# Patient Record
Sex: Male | Born: 1955 | Race: White | Hispanic: No | Marital: Single | Smoking: Former smoker
Health system: Southern US, Community
[De-identification: ages and names within clinical notes are randomized; demographics above are authoritative.]

## PROBLEM LIST (undated history)

## (undated) DIAGNOSIS — J449 Chronic obstructive pulmonary disease, unspecified: Secondary | ICD-10-CM

## (undated) HISTORY — PX: APPENDECTOMY: SHX54

---

## 2014-06-19 ENCOUNTER — Emergency Department
Admit: 2014-06-19 | Disposition: A | Discharge status: Home / Self Care | Payer: Self-pay | Admitting: Emergency Medicine

## 2014-06-19 ENCOUNTER — Encounter: Payer: Self-pay | Admitting: Family Medicine

## 2015-07-08 ENCOUNTER — Emergency Department
Admission: EM | Admit: 2015-07-08 | Discharge: 2015-07-08 | Disposition: A | Discharge status: Home / Self Care | Payer: Self-pay | Attending: Emergency Medicine | Admitting: Emergency Medicine

## 2015-07-08 ENCOUNTER — Emergency Department: Payer: Self-pay

## 2015-07-08 ENCOUNTER — Encounter: Payer: Self-pay | Admitting: Emergency Medicine

## 2015-07-08 DIAGNOSIS — F17200 Nicotine dependence, unspecified, uncomplicated: Secondary | ICD-10-CM | POA: Insufficient documentation

## 2015-07-08 DIAGNOSIS — J209 Acute bronchitis, unspecified: Secondary | ICD-10-CM | POA: Insufficient documentation

## 2015-07-08 DIAGNOSIS — J441 Chronic obstructive pulmonary disease with (acute) exacerbation: Secondary | ICD-10-CM | POA: Insufficient documentation

## 2015-07-08 DIAGNOSIS — J440 Chronic obstructive pulmonary disease with acute lower respiratory infection: Secondary | ICD-10-CM | POA: Insufficient documentation

## 2015-07-08 HISTORY — DX: Chronic obstructive pulmonary disease, unspecified: J44.9

## 2015-07-08 LAB — BASIC METABOLIC PANEL
ANION GAP: 12 (ref 5–15)
BUN: 5 mg/dL — ABNORMAL LOW (ref 6–20)
CALCIUM: 8.8 mg/dL — AB (ref 8.9–10.3)
CO2: 24 mmol/L (ref 22–32)
Chloride: 100 mmol/L — ABNORMAL LOW (ref 101–111)
Creatinine, Ser: 0.58 mg/dL — ABNORMAL LOW (ref 0.61–1.24)
GFR calc Af Amer: >60 mL/min (ref >60)
Glucose, Bld: 135 mg/dL — ABNORMAL HIGH (ref 65–99)
Potassium: 3.9 mmol/L (ref 3.5–5.1)
SODIUM: 136 mmol/L (ref 135–145)

## 2015-07-08 LAB — CBC WITH DIFFERENTIAL/PLATELET
Basophils Absolute: 0 10*3/uL (ref 0–0.1)
Eosinophils Absolute: 0 10*3/uL (ref 0–0.7)
Eosinophils Relative: 0 %
HEMATOCRIT: 47.1 % (ref 40.0–52.0)
HEMOGLOBIN: 16.1 g/dL (ref 13.0–18.0)
Lymphocytes Relative: 7 %
Lymphs Abs: 0.8 10*3/uL — ABNORMAL LOW (ref 1.0–3.6)
MCH: 33 pg (ref 26.0–34.0)
MCHC: 34.1 g/dL (ref 32.0–36.0)
MCV: 96.8 fL (ref 80.0–100.0)
Monocytes Absolute: 1.2 10*3/uL — ABNORMAL HIGH (ref 0.2–1.0)
Monocytes Relative: 12 %
NEUTROS ABS: 8.3 10*3/uL — AB (ref 1.4–6.5)
Neutrophils Relative %: 80 %
Platelets: 184 10*3/uL (ref 150–440)
RBC: 4.86 MIL/uL (ref 4.40–5.90)
RDW: 12.4 % (ref 11.5–14.5)
WBC: 10.4 10*3/uL (ref 3.8–10.6)

## 2015-07-08 MED ORDER — IPRATROPIUM-ALBUTEROL 0.5-2.5 (3) MG/3ML IN SOLN
3.0000 mL | Freq: Once | RESPIRATORY_TRACT | Status: AC
  Administered 2015-07-08: 3 mL via RESPIRATORY_TRACT
  Filled 2015-07-08: qty 3

## 2015-07-08 MED ORDER — NAPROXEN 500 MG PO TABS: 500.0000 mg | ORAL_TABLET | Freq: Two times a day (BID) | ORAL | Status: DC

## 2015-07-08 MED ORDER — ALBUTEROL SULFATE HFA 108 (90 BASE) MCG/ACT IN AERS: 2.0000 | INHALATION_SPRAY | RESPIRATORY_TRACT | Status: DC | PRN

## 2015-07-08 MED ORDER — AZITHROMYCIN 250 MG PO TABS: ORAL_TABLET | ORAL | Status: DC

## 2015-07-08 MED ORDER — PREDNISONE 20 MG PO TABS: 40.0000 mg | ORAL_TABLET | Freq: Every day | ORAL | Status: DC

## 2015-07-08 MED ORDER — METHYLPREDNISOLONE SODIUM SUCC 125 MG IJ SOLR
125.0000 mg | Freq: Once | INTRAMUSCULAR | Status: AC
  Administered 2015-07-08: 125 mg via INTRAVENOUS
  Filled 2015-07-08: qty 2

## 2015-07-08 MED ORDER — GUAIFENESIN 100 MG/5ML PO SOLN: 5.0000 mL | ORAL | Status: DC | PRN

## 2015-07-08 NOTE — ED Provider Notes (Signed)
[facility] Emergency Department Provider Note  ____________________________________________  Time seen: 10:10 AM  I have reviewed the triage vital signs and the nursing notes.   HISTORY  Chief Complaint Shortness of Breath; Cough; and Nasal Congestion    HPI Charles Campos is a 60 y.o. male who complains of worsening shortness of breath and coughing that has become productive over the past 2 weeks. No chest pain. No exertional symptoms. No fevers chills dizziness or syncope. Discussed this. Does have some night sweats but no fevers or chills.     Past Medical History  Diagnosis Date  . COPD (chronic obstructive pulmonary disease) (HCC)      There are no active problems to display for this patient.    History reviewed. No pertinent past surgical history.   Current Outpatient Rx  Name  Route  Sig  Dispense  Refill  . albuterol (PROVENTIL HFA) 108 (90 Base) MCG/ACT inhaler   Inhalation   Inhale 2 puffs into the lungs every 4 (four) hours as needed for wheezing or shortness of breath.   1 Inhaler   0   . azithromycin (ZITHROMAX Z-PAK) 250 MG tablet      Take 2 tablets (500 mg) on  Day 1,  followed by 1 tablet (250 mg) once daily on Days 2 through 5.   6 each   0   . guaiFENesin (ROBITUSSIN) 100 MG/5ML SOLN   Oral   Take 5 mLs (100 mg total) by mouth every 4 (four) hours as needed for cough or to loosen phlegm.   120 mL   0   . naproxen (NAPROSYN) 500 MG tablet   Oral   Take 1 tablet (500 mg total) by mouth 2 (two) times daily with a meal.   20 tablet   0   . predniSONE (DELTASONE) 20 MG tablet   Oral   Take 2 tablets (40 mg total) by mouth daily.   8 tablet   0    No current outpatient medications.  Allergies Review of patient's allergies indicates no known allergies.   History reviewed. No pertinent family history.  Social History Social History  Substance Use Topics  . Smoking status: Current Every Day Smoker   . Smokeless tobacco: None  . Alcohol Use: Yes    Review of Systems  Constitutional:   No fever or chills.  Eyes:   No vision changes.  ENT:   No sore throat. No rhinorrhea. Cardiovascular:   No chest pain. Respiratory:   Positive shortness of breath and productive cough. Gastrointestinal:   Negative for abdominal pain, vomiting and diarrhea.  Genitourinary:   Negative for dysuria or difficulty urinating. Musculoskeletal:   Negative for focal pain or swelling Neurological:   Negative for headaches 10-point ROS otherwise negative.  ____________________________________________   PHYSICAL EXAM:  VITAL SIGNS: ED Triage Vitals  Enc Vitals Group     BP 07/08/15 0943 152/89 mmHg     Pulse Rate 07/08/15 0943 100     Resp 07/08/15 0943 20     Temp 07/08/15 0943 97.2 F (36.2 C)     Temp Source 07/08/15 0943 Oral     SpO2 07/08/15 0943 95 %     Weight 07/08/15 0943 117 lb (53.071 kg)     Height 07/08/15 0943  (1.651 m)     Head Cir --      Peak Flow --      Pain Score 07/08/15 0944 3  Pain Loc --      Pain Edu? --      Excl. in GC? --     Vital signs reviewed, nursing assessments reviewed.   Constitutional:   Alert and oriented. Well appearing and in no distress. Eyes:   No scleral icterus. No conjunctival pallor. PERRL. EOMI.  No nystagmus. ENT   Head:   Normocephalic and atraumatic.   Nose:   No congestion/rhinnorhea. No septal hematoma   Mouth/Throat:   MMM, no pharyngeal erythema. No peritonsillar mass.    Neck:   No stridor. No SubQ emphysema. No meningismus. Hematological/Lymphatic/Immunilogical:   No cervical lymphadenopathy. Cardiovascular:   RRR. Symmetric bilateral radial and DP pulses.  No murmurs.  Respiratory:   Normal respiratory effort without tachypnea nor retractions.Diffuse expiratory wheezing and mildly prolonged expert right face  Gastrointestinal:   Soft and nontender. Non distended. There is no CVA tenderness.  No rebound,  rigidity, or guarding. Genitourinary:   deferred Musculoskeletal:   Nontender with normal range of motion in all extremities. No joint effusions.  No lower extremity tenderness.  No edema. Neurologic:   Normal speech and language.  CN 2-10 normal. Motor grossly intact. No gross focal neurologic deficits are appreciated.  Skin:    Skin is warm, dry and intact. No rash noted.  No petechiae, purpura, or bullae.  ____________________________________________    LABS (pertinent positives/negatives) (all labs ordered are listed, but only abnormal results are displayed) Labs Reviewed  BASIC METABOLIC PANEL - Abnormal; Notable for the following:    Chloride 100 (*)    Glucose, Bld 135 (*)    BUN 5 (*)    Creatinine, Ser 0.58 (*)    Calcium 8.8 (*)    All other components within normal limits  CBC WITH DIFFERENTIAL/PLATELET - Abnormal; Notable for the following:    Neutro Abs 8.3 (*)    Lymphs Abs 0.8 (*)    Monocytes Absolute 1.2 (*)    All other components within normal limits   ____________________________________________   EKG  Interpreted by me Normal sinus rhythm rate of 96, normal axis intervals QRS ST and T segments.  ____________________________________________    RADIOLOGY  Chest x-ray shows hyperinflation Brother was unremarkable  ____________________________________________   PROCEDURES   ____________________________________________   INITIAL IMPRESSION / ASSESSMENT AND PLAN / ED COURSE  Pertinent labs & imaging results that were available during my care of the patient were reviewed by me and considered in my medical decision making (see chart for details).  Patient well appearing no acute distress, vital signs unremarkable except for slight tachycardia at triage. On my exam he is not tachycardic. Symptoms and examination are consistent with acute bronchitis and COPD exacerbation. We have the patient steroids and bronchodilators, discharge on prednisone and  albuterol guaifenesin azithromycin and NSAIDs. Follow up with primary care.Considering the patient's symptoms, medical history, and physical examination today, I have low suspicion for ACS, PE, TAD, pneumothorax, carditis, mediastinitis, pneumonia, CHF, or sepsis.       ____________________________________________   FINAL CLINICAL IMPRESSION(S) / ED DIAGNOSES  Final diagnoses:  COPD with exacerbation (HCC)  Acute bronchitis, unspecified organism       Portions of this note were generated with dragon dictation software. Dictation errors may occur despite best attempts at proofreading.   Sharman CheekPhillip Chirsty Armistead, MD 07/08/15 1049

## 2015-07-08 NOTE — Discharge Instructions (Signed)
Acute Bronchitis Bronchitis is inflammation of the airways that extend from the windpipe into the lungs (bronchi). The inflammation often causes mucus to develop. This leads to a cough, which is the most common symptom of bronchitis.  In acute bronchitis, the condition usually develops suddenly and goes away over time, usually in a couple weeks. Smoking, allergies, and asthma can make bronchitis worse. Repeated episodes of bronchitis may cause further lung problems.  CAUSES Acute bronchitis is most often caused by the same virus that causes a cold. The virus can spread from person to person (contagious) through coughing, sneezing, and touching contaminated objects. SIGNS AND SYMPTOMS   Cough.   Fever.   Coughing up mucus.   Body aches.   Chest congestion.   Chills.   Shortness of breath.   Sore throat.  DIAGNOSIS  Acute bronchitis is usually diagnosed through a physical exam. Your health care provider will also ask you questions about your medical history. Tests, such as chest X-rays, are sometimes done to rule out other conditions.  TREATMENT  Acute bronchitis usually goes away in a couple weeks. Oftentimes, no medical treatment is necessary. Medicines are sometimes given for relief of fever or cough. Antibiotic medicines are usually not needed but may be prescribed in certain situations. In some cases, an inhaler may be recommended to help reduce shortness of breath and control the cough. A cool mist vaporizer may also be used to help thin bronchial secretions and make it easier to clear the chest.  HOME CARE INSTRUCTIONS  Get plenty of rest.   Drink enough fluids to keep your urine clear or pale yellow (unless you have a medical condition that requires fluid restriction). Increasing fluids may help thin your respiratory secretions (sputum) and reduce chest congestion, and it will prevent dehydration.   Take medicines only as directed by your health care provider.  If  you were prescribed an antibiotic medicine, finish it all even if you start to feel better.  Avoid smoking and secondhand smoke. Exposure to cigarette smoke or irritating chemicals will make bronchitis worse. If you are a smoker, consider using nicotine gum or skin patches to help control withdrawal symptoms. Quitting smoking will help your lungs heal faster.   Reduce the chances of another bout of acute bronchitis by washing your hands frequently, avoiding people with cold symptoms, and trying not to touch your hands to your mouth, nose, or eyes.   Keep all follow-up visits as directed by your health care provider.  SEEK MEDICAL CARE IF: Your symptoms do not improve after 1 week of treatment.  SEEK IMMEDIATE MEDICAL CARE IF:  You develop an increased fever or chills.   You have chest pain.   You have severe shortness of breath.  You have bloody sputum.   You develop dehydration.  You faint or repeatedly feel like you are going to pass out.  You develop repeated vomiting.  You develop a severe headache. MAKE SURE YOU:   Understand these instructions.  Will watch your condition.  Will get help right away if you are not doing well or get worse.   This information is not intended to replace advice given to you by your health care provider. Make sure you discuss any questions you have with your health care provider.   Document Released: 03/18/2004 Document Revised: 03/01/2014 Document Reviewed: 08/01/2012 Elsevier Interactive Patient Education 2016 [employer]  Chronic Obstructive Pulmonary Disease Exacerbation Chronic obstructive pulmonary disease (COPD) is a common lung problem. In COPD, the  flow of air from the lungs is limited. COPD exacerbations are times that breathing gets worse and you need extra treatment. Without treatment they can be life threatening. If they happen often, your lungs can become more damaged. If your COPD gets worse, your doctor may treat you  with:  Medicines.  Oxygen.  Different ways to clear your airway, such as using a mask. HOME CARE  Do not smoke.  Avoid tobacco smoke and other things that bother your lungs.  If given, take your antibiotic medicine as told. Finish the medicine even if you start to feel better.  Only take medicines as told by your doctor.  Drink enough fluids to keep your pee (urine) clear or pale yellow (unless your doctor has told you not to).  Use a cool mist machine (vaporizer).  If you use oxygen or a machine that turns liquid medicine into a mist (nebulizer), continue to use them as told.  Keep up with shots (vaccinations) as told by your doctor.  Exercise regularly.  Eat healthy foods.  Keep all doctor visits as told. GET HELP RIGHT AWAY IF:  You are very short of breath and it gets worse.  You have trouble talking.  You have bad chest pain.  You have blood in your spit (sputum).  You have a fever.  You keep throwing up (vomiting).  You feel weak, or you pass out (faint).  You feel confused.  You keep getting worse. MAKE SURE YOU:  Understand these instructions.  Will watch your condition.  Will get help right away if you are not doing well or get worse.   This information is not intended to replace advice given to you by your health care provider. Make sure you discuss any questions you have with your health care provider.   Document Released: 01/28/2011 Document Revised: 03/01/2014 Document Reviewed: 10/13/2012 Elsevier Interactive Patient Education [employer]

## 2015-07-08 NOTE — ED Notes (Signed)
Pt to ed with c/o sob and difficulty breathing.  Pt states seen here about 1 year ago and dx with COPD.  Pt reports he does smoke about 1 pack every 3 days.  Pt states reports minimal productive green sputum from cough.  Pt states fever about 4 days ago.  Pt denies sore throat.  Denies earaches. No swelling noted to lower ext.

## 2015-07-08 NOTE — ED Notes (Signed)
MD at bedside. 

## 2017-05-25 IMAGING — CR DG CHEST 2V
1 series · 2 of 2 positions shown · non-contrast
Comparison: June 19, 2014

CLINICAL DATA: Shortness of breath and cough, chronic

EXAM:
CHEST  2 VIEW

[Series 1: dg chest 2 view · 0.14mm/px · 2 of 2 slices shown]
[im 1/2]
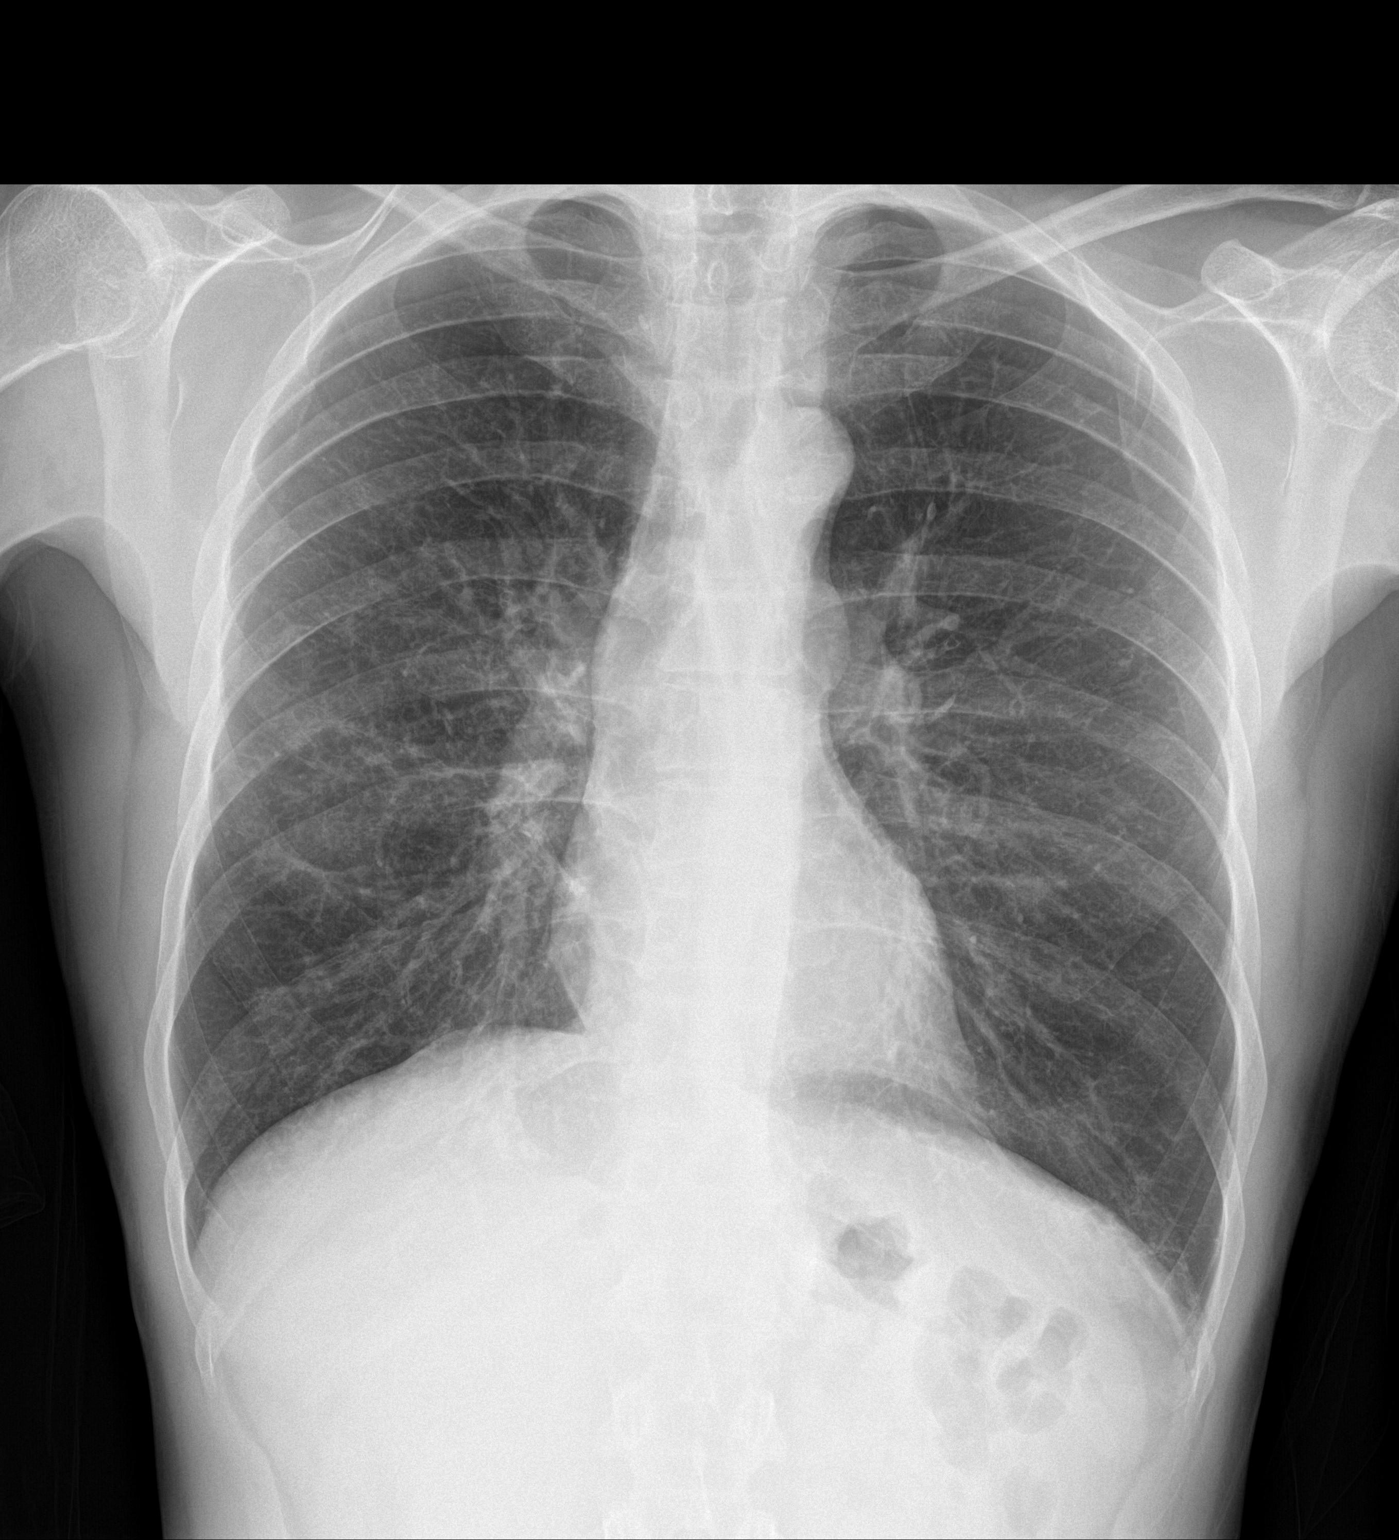
[im 2/2]
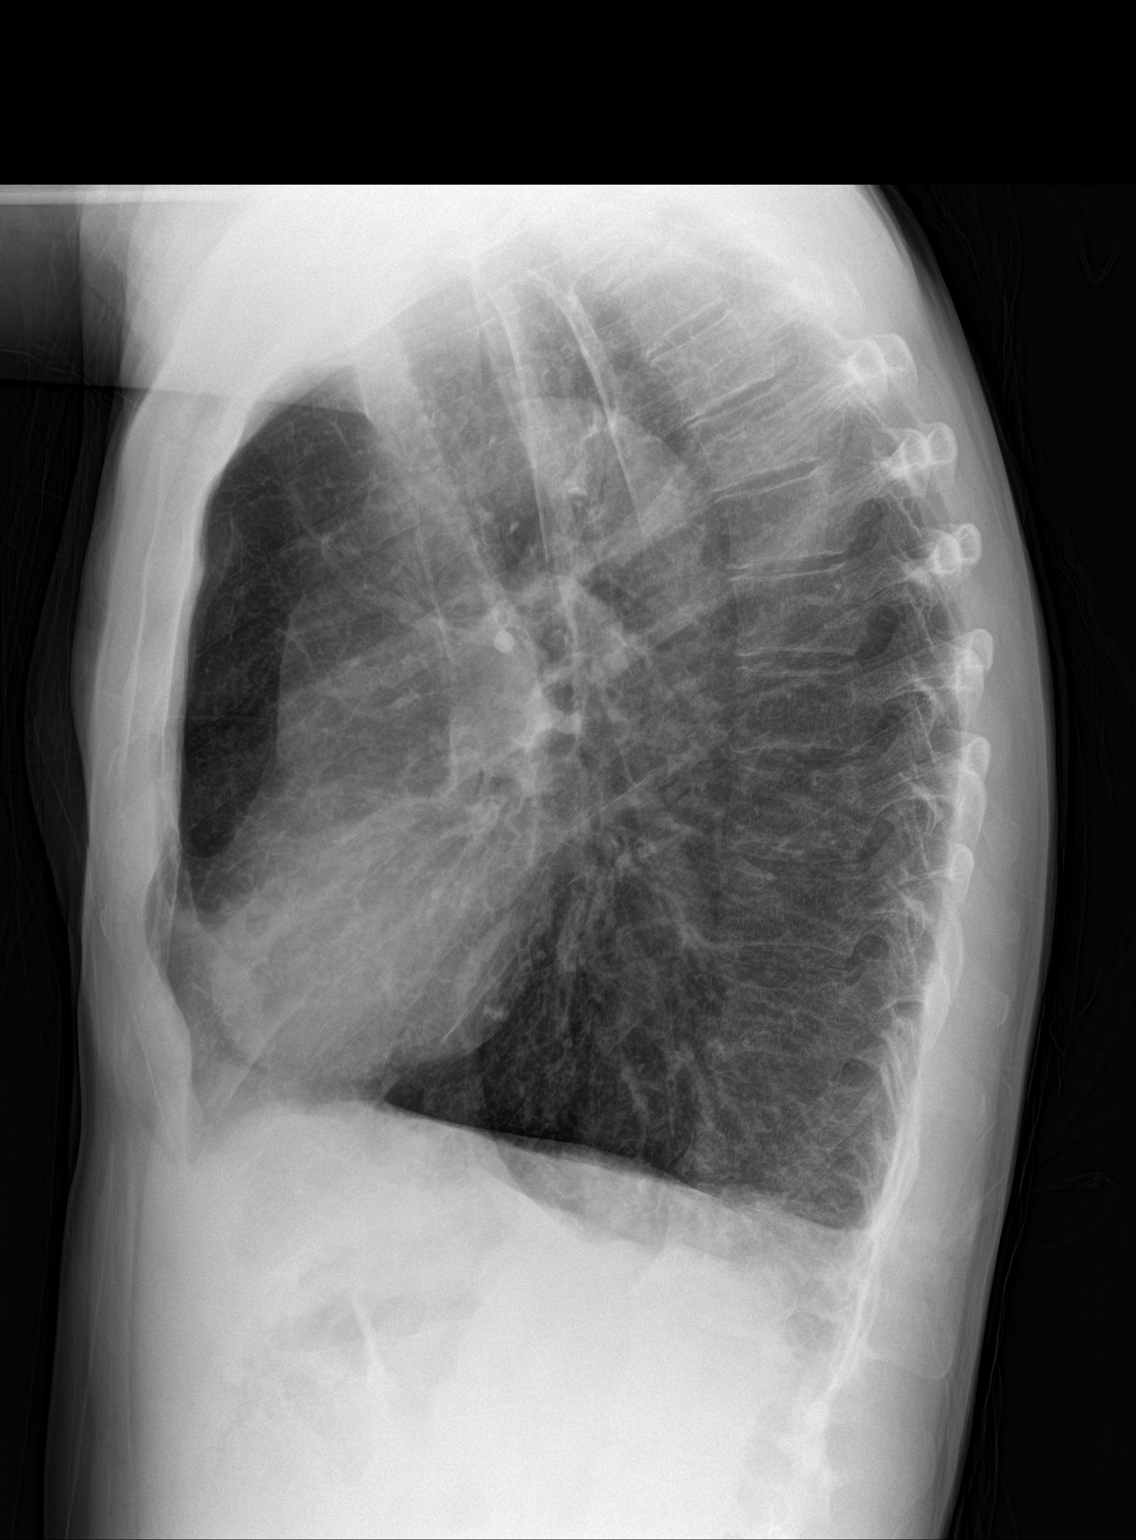

[2 of 2 positions shown; findings below may reference images not displayed]

FINDINGS: Lungs are hyperexpanded. There is mild scarring in the right mid
lung. There is no edema or consolidation. Heart size and pulmonary
vascularity are within normal limits. No adenopathy. No bone
lesions.
IMPRESSION: Underlying hyper expansion with mild scarring right mid lung. No
edema or consolidation. Stable cardiac silhouette.

## 2018-06-07 ENCOUNTER — Other Ambulatory Visit: Payer: Self-pay

## 2018-06-07 ENCOUNTER — Emergency Department
Admission: EM | Admit: 2018-06-07 | Discharge: 2018-06-07 | Disposition: A | Discharge status: Home / Self Care | Payer: Self-pay | Attending: Emergency Medicine | Admitting: Emergency Medicine

## 2018-06-07 ENCOUNTER — Emergency Department: Payer: Self-pay

## 2018-06-07 DIAGNOSIS — F17210 Nicotine dependence, cigarettes, uncomplicated: Secondary | ICD-10-CM | POA: Insufficient documentation

## 2018-06-07 DIAGNOSIS — J449 Chronic obstructive pulmonary disease, unspecified: Secondary | ICD-10-CM | POA: Insufficient documentation

## 2018-06-07 DIAGNOSIS — Z23 Encounter for immunization: Secondary | ICD-10-CM | POA: Insufficient documentation

## 2018-06-07 DIAGNOSIS — Y929 Unspecified place or not applicable: Secondary | ICD-10-CM | POA: Insufficient documentation

## 2018-06-07 DIAGNOSIS — S0990XA Unspecified injury of head, initial encounter: Secondary | ICD-10-CM

## 2018-06-07 DIAGNOSIS — Z79899 Other long term (current) drug therapy: Secondary | ICD-10-CM | POA: Insufficient documentation

## 2018-06-07 DIAGNOSIS — Y939 Activity, unspecified: Secondary | ICD-10-CM | POA: Insufficient documentation

## 2018-06-07 DIAGNOSIS — Y999 Unspecified external cause status: Secondary | ICD-10-CM | POA: Insufficient documentation

## 2018-06-07 DIAGNOSIS — S0101XA Laceration without foreign body of scalp, initial encounter: Secondary | ICD-10-CM | POA: Insufficient documentation

## 2018-06-07 MED ORDER — LIDOCAINE-EPINEPHRINE (PF) 2 %-1:200000 IJ SOLN
20.0000 mL | Freq: Once | INTRAMUSCULAR | Status: AC
  Administered 2018-06-07: 20 mL
  Filled 2018-06-07: qty 20

## 2018-06-07 MED ORDER — TETANUS-DIPHTH-ACELL PERTUSSIS 5-2.5-18.5 LF-MCG/0.5 IM SUSP
0.5000 mL | Freq: Once | INTRAMUSCULAR | Status: AC
  Administered 2018-06-07: 19:00:00 0.5 mL via INTRAMUSCULAR
  Filled 2018-06-07: qty 0.5

## 2018-06-07 NOTE — ED Notes (Signed)
MD placed 9 staples in head and patient is speaking with brother in [facility] to give him an update. Nurse obtained taxi voucher to discharge patient to a safe place a friends house.

## 2018-06-07 NOTE — Discharge Instructions (Addendum)
Your CT scans did not show any serious internal injuries today.  The 2 lacerations on the top of your head were repaired with a total of 9 staples.  These will need to be removed in 1 week.  You can follow-up with your primary care doctor, urgent care or the ER to have this taken care of.  You should also be seen in 2 days to ensure that the wound is healing well and not getting infected.  You were given a tetanus shot today to protect you from some types of infection.  You may shower as desired, but you should avoid scrubbing the area with your fingers or a brush.
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
## 2018-06-14 ENCOUNTER — Emergency Department
Admission: EM | Admit: 2018-06-14 | Discharge: 2018-06-14 | Disposition: A | Discharge status: Home / Self Care | Payer: Self-pay | Attending: Emergency Medicine | Admitting: Emergency Medicine

## 2018-06-14 ENCOUNTER — Other Ambulatory Visit: Payer: Self-pay

## 2018-06-14 DIAGNOSIS — Z4802 Encounter for removal of sutures: Secondary | ICD-10-CM | POA: Insufficient documentation

## 2018-06-14 DIAGNOSIS — J449 Chronic obstructive pulmonary disease, unspecified: Secondary | ICD-10-CM | POA: Insufficient documentation

## 2018-06-14 DIAGNOSIS — Z79899 Other long term (current) drug therapy: Secondary | ICD-10-CM | POA: Insufficient documentation

## 2018-06-14 DIAGNOSIS — F17210 Nicotine dependence, cigarettes, uncomplicated: Secondary | ICD-10-CM | POA: Insufficient documentation

## 2018-06-14 NOTE — ED Notes (Signed)
PA, Wagner to bedside for staple removal.

## 2018-06-14 NOTE — ED Provider Notes (Signed)
[facility] Emergency Department Provider Note  ____________________________________________  Time seen: Approximately 1:13 PM  I have reviewed the triage vital signs and the nursing notes.   HISTORY  Chief Complaint Suture / Staple Removal    HPI Charles Campos is a 63 y.o. male that presents the emergency department for staple removal.  No concerns with staples.   Past Medical History:  Diagnosis Date  . COPD (chronic obstructive pulmonary disease) (HCC)     There are no active problems to display for this patient.   History reviewed. No pertinent surgical history.  Prior to Admission medications   Medication Sig Start Date End Date Taking? Authorizing Provider  albuterol (PROVENTIL HFA) 108 (90 Base) MCG/ACT inhaler Inhale 2 puffs into the lungs every 4 (four) hours as needed for wheezing or shortness of breath. 07/08/15   Sharman CheekStafford, Phillip, MD  azithromycin (ZITHROMAX Z-PAK) 250 MG tablet Take 2 tablets (500 mg) on  Day 1,  followed by 1 tablet (250 mg) once daily on Days 2 through 5. Patient not taking: Reported on 06/07/2018 07/08/15   Sharman CheekStafford, Phillip, MD  guaiFENesin (ROBITUSSIN) 100 MG/5ML SOLN Take 5 mLs (100 mg total) by mouth every 4 (four) hours as needed for cough or to loosen phlegm. Patient not taking: Reported on 06/07/2018 07/08/15   Sharman CheekStafford, Phillip, MD  naproxen (NAPROSYN) 500 MG tablet Take 1 tablet (500 mg total) by mouth 2 (two) times daily with a meal. Patient not taking: Reported on 06/07/2018 07/08/15   Sharman CheekStafford, Phillip, MD  predniSONE (DELTASONE) 20 MG tablet Take 2 tablets (40 mg total) by mouth daily. Patient not taking: Reported on 06/07/2018 07/08/15   Sharman CheekStafford, Phillip, MD    Allergies Patient has no known allergies.  No family history on file.  Social History Social History   Tobacco Use  . Smoking status: Current Every Day Smoker  . Smokeless tobacco: Never Used  Substance Use Topics  . Alcohol use: Yes  .  Drug use: No     Review of Systems  Constitutional: No fever/chills Respiratory: No SOB. Musculoskeletal: Negative for musculoskeletal pain. Skin: Negative for rash, ecchymosis.  Positive for stapled laceration. Neurological: Negative for headaches   ____________________________________________   PHYSICAL EXAM:  VITAL SIGNS: ED Triage Vitals [06/14/18 1241]  Enc Vitals Group     BP      Pulse      Resp      Temp      Temp src      SpO2      Weight 115 lb (52.2 kg)     Height 5\' 6"  (1.676 m)     Head Circumference      Peak Flow      Pain Score 0     Pain Loc      Pain Edu?      Excl. in GC?      Constitutional: Alert and oriented. Well appearing and in no acute distress. Eyes: Conjunctivae are normal. PERRL. EOMI. Head: 9 staples in place to scalp.  No surrounding erythema.  No drainage.  No tenderness to palpation. ENT:      Ears:      Nose: No congestion/rhinnorhea.      Mouth/Throat: Mucous membranes are moist.  Neck: No stridor.   Cardiovascular: Good peripheral circulation. Respiratory: Normal respiratory effort without tachypnea or retractions.  Musculoskeletal: Full range of motion to all extremities. No gross deformities appreciated. Neurologic:  Normal speech and language. No gross focal neurologic deficits  are appreciated.  Skin:  Skin is warm, dry. No rash noted. Psychiatric: Mood and affect are normal. Speech and behavior are normal. Patient exhibits appropriate insight and judgement.   ____________________________________________   LABS (all labs ordered are listed, but only abnormal results are displayed)  Labs Reviewed - No data to display ____________________________________________  EKG   ____________________________________________  RADIOLOGY   No results found.  ____________________________________________    PROCEDURES  Procedure(s) performed:    Procedures  SUTURE REMOVAL Performed by: Enid Derry  Consent:  Verbal consent obtained. Consent given by: patient Required items: required blood products, implants, devices, and special equipment available Time out: Immediately prior to procedure a "time out" was called to verify the correct patient, procedure, equipment, support staff and site/side marked as required.  Location: scalp  Wound Appearance: clean  Sutures/Staples Removed: 9  Patient tolerance: Patient tolerated the procedure well with no immediate complications.    Medications - No data to display   ____________________________________________   INITIAL IMPRESSION / ASSESSMENT AND PLAN / ED COURSE  Pertinent labs & imaging results that were available during my care of the patient were reviewed by me and considered in my medical decision making (see chart for details).  Review of the [place] CSRS was performed in accordance of the [facility] prior to dispensing any controlled drugs.     Patient presented to emergency department for staple removal.  Staples were removed in the emergency department.  No signs of infection.  Patient is to follow up with primary care as directed. Patient is given ED precautions to return to the ED for any worsening or new symptoms.     ____________________________________________  FINAL CLINICAL IMPRESSION(S) / ED DIAGNOSES  Final diagnoses:  Encounter for staple removal      NEW MEDICATIONS STARTED DURING THIS VISIT:  ED Discharge Orders    None          This chart was dictated using voice recognition software/Dragon. Despite best efforts to proofread, errors can occur which can change the meaning. Any change was purely unintentional.    Enid Derry, PA-C 06/14/18 1335    Arnaldo Natal, MD 06/14/18 [phone]

## 2018-06-14 NOTE — ED Triage Notes (Signed)
Pt is here for suture removal from scalp. Denies any sx.

## 2019-06-21 ENCOUNTER — Other Ambulatory Visit: Payer: Self-pay
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
## 2021-12-07 ENCOUNTER — Encounter: Payer: Self-pay | Admitting: Internal Medicine

## 2021-12-07 ENCOUNTER — Inpatient Hospital Stay
Admission: EM | Admit: 2021-12-07 | Discharge: 2021-12-14 | DRG: 190 | Disposition: A | Discharge status: Home / Self Care | Payer: Self-pay | Attending: Internal Medicine | Admitting: Internal Medicine

## 2021-12-07 ENCOUNTER — Emergency Department: Payer: Self-pay

## 2021-12-07 ENCOUNTER — Other Ambulatory Visit: Payer: Self-pay

## 2021-12-07 DIAGNOSIS — D72829 Elevated white blood cell count, unspecified: Secondary | ICD-10-CM | POA: Diagnosis present

## 2021-12-07 DIAGNOSIS — Z720 Tobacco use: Secondary | ICD-10-CM | POA: Diagnosis present

## 2021-12-07 DIAGNOSIS — Z1152 Encounter for screening for COVID-19: Secondary | ICD-10-CM

## 2021-12-07 DIAGNOSIS — L89151 Pressure ulcer of sacral region, stage 1: Secondary | ICD-10-CM | POA: Diagnosis present

## 2021-12-07 DIAGNOSIS — Z681 Body mass index (BMI) 19 or less, adult: Secondary | ICD-10-CM

## 2021-12-07 DIAGNOSIS — F1010 Alcohol abuse, uncomplicated: Secondary | ICD-10-CM | POA: Diagnosis present

## 2021-12-07 DIAGNOSIS — F1020 Alcohol dependence, uncomplicated: Secondary | ICD-10-CM | POA: Diagnosis present

## 2021-12-07 DIAGNOSIS — R64 Cachexia: Secondary | ICD-10-CM | POA: Diagnosis present

## 2021-12-07 DIAGNOSIS — K224 Dyskinesia of esophagus: Secondary | ICD-10-CM | POA: Diagnosis present

## 2021-12-07 DIAGNOSIS — Z8249 Family history of ischemic heart disease and other diseases of the circulatory system: Secondary | ICD-10-CM

## 2021-12-07 DIAGNOSIS — R634 Abnormal weight loss: Secondary | ICD-10-CM | POA: Diagnosis present

## 2021-12-07 DIAGNOSIS — E871 Hypo-osmolality and hyponatremia: Secondary | ICD-10-CM | POA: Diagnosis present

## 2021-12-07 DIAGNOSIS — Z79899 Other long term (current) drug therapy: Secondary | ICD-10-CM

## 2021-12-07 DIAGNOSIS — J9601 Acute respiratory failure with hypoxia: Secondary | ICD-10-CM | POA: Diagnosis present

## 2021-12-07 DIAGNOSIS — L8990 Pressure ulcer of unspecified site, unspecified stage: Secondary | ICD-10-CM | POA: Insufficient documentation

## 2021-12-07 DIAGNOSIS — R0602 Shortness of breath: Secondary | ICD-10-CM

## 2021-12-07 DIAGNOSIS — F17210 Nicotine dependence, cigarettes, uncomplicated: Secondary | ICD-10-CM | POA: Diagnosis present

## 2021-12-07 DIAGNOSIS — I10 Essential (primary) hypertension: Secondary | ICD-10-CM | POA: Diagnosis present

## 2021-12-07 DIAGNOSIS — J441 Chronic obstructive pulmonary disease with (acute) exacerbation: Principal | ICD-10-CM | POA: Diagnosis present

## 2021-12-07 DIAGNOSIS — Z23 Encounter for immunization: Secondary | ICD-10-CM

## 2021-12-07 DIAGNOSIS — E440 Moderate protein-calorie malnutrition: Secondary | ICD-10-CM | POA: Diagnosis present

## 2021-12-07 LAB — COMPREHENSIVE METABOLIC PANEL
ALT: 26 U/L (ref 0–44)
AST: 28 U/L (ref 15–41)
Albumin: 3.4 g/dL — ABNORMAL LOW (ref 3.5–5.0)
Alkaline Phosphatase: 47 U/L (ref 38–126)
Anion gap: 10 (ref 5–15)
BUN: 12 mg/dL (ref 8–23)
CO2: 29 mmol/L (ref 22–32)
Calcium: 8.8 mg/dL — ABNORMAL LOW (ref 8.9–10.3)
Chloride: 78 mmol/L — ABNORMAL LOW (ref 98–111)
Creatinine, Ser: 0.46 mg/dL — ABNORMAL LOW (ref 0.61–1.24)
GFR, Estimated: >60 mL/min (ref >60)
Glucose, Bld: 117 mg/dL — ABNORMAL HIGH (ref 70–99)
Potassium: 4.3 mmol/L (ref 3.5–5.1)
Sodium: 117 mmol/L — CL (ref 135–145)
Total Bilirubin: 1.4 mg/dL — ABNORMAL HIGH (ref 0.3–1.2)
Total Protein: 6.9 g/dL (ref 6.5–8.1)

## 2021-12-07 LAB — CBC WITH DIFFERENTIAL/PLATELET
Abs Immature Granulocytes: 0.08 10*3/uL — ABNORMAL HIGH (ref 0.00–0.07)
Basophils Absolute: 0 10*3/uL (ref 0.0–0.1)
Basophils Relative: 0 %
Eosinophils Absolute: 0 10*3/uL (ref 0.0–0.5)
Eosinophils Relative: 0 %
HCT: 44.8 % (ref 39.0–52.0)
Hemoglobin: 16.2 g/dL (ref 13.0–17.0)
Immature Granulocytes: 1 %
Lymphocytes Relative: 3 %
Lymphs Abs: 0.4 10*3/uL — ABNORMAL LOW (ref 0.7–4.0)
MCH: 32.3 pg (ref 26.0–34.0)
MCHC: 36.2 g/dL — ABNORMAL HIGH (ref 30.0–36.0)
MCV: 89.4 fL (ref 80.0–100.0)
Monocytes Absolute: 2 10*3/uL — ABNORMAL HIGH (ref 0.1–1.0)
Monocytes Relative: 17 %
Neutro Abs: 9.7 10*3/uL — ABNORMAL HIGH (ref 1.7–7.7)
Neutrophils Relative %: 79 %
Platelets: 194 10*3/uL (ref 150–400)
RBC: 5.01 MIL/uL (ref 4.22–5.81)
RDW: 10.7 % — ABNORMAL LOW (ref 11.5–15.5)
Smear Review: NORMAL
WBC: 12.3 10*3/uL — ABNORMAL HIGH (ref 4.0–10.5)
nRBC: 0 % (ref 0.0–0.2)

## 2021-12-07 LAB — RESP PANEL BY RT-PCR (FLU A&B, COVID) ARPGX2
Influenza A by PCR: NEGATIVE
Influenza B by PCR: NEGATIVE
SARS Coronavirus 2 by RT PCR: NEGATIVE

## 2021-12-07 LAB — TROPONIN I (HIGH SENSITIVITY)
Troponin I (High Sensitivity): 11 ng/L (ref <18)
Troponin I (High Sensitivity): 14 ng/L (ref <18)

## 2021-12-07 LAB — LACTIC ACID, PLASMA
Lactic Acid, Venous: 1.3 mmol/L (ref 0.5–1.9)
Lactic Acid, Venous: 1.1 mmol/L (ref 0.5–1.9)

## 2021-12-07 MED ORDER — SODIUM CHLORIDE 0.9 % IV SOLN
1.0000 g | Freq: Once | INTRAVENOUS | Status: AC
  Administered 2021-12-07: 1 g via INTRAVENOUS
  Filled 2021-12-07: qty 10

## 2021-12-07 MED ORDER — SODIUM CHLORIDE 0.9 % IV SOLN
1.0000 g | INTRAVENOUS | Status: AC
  Administered 2021-12-08 – 2021-12-11 (×4): 1 g via INTRAVENOUS
  Filled 2021-12-07: qty 1
  Filled 2021-12-07 (×3): qty 10

## 2021-12-07 MED ORDER — METHYLPREDNISOLONE SODIUM SUCC 40 MG IJ SOLR
40.0000 mg | Freq: Two times a day (BID) | INTRAMUSCULAR | Status: AC
  Administered 2021-12-08 (×2): 40 mg via INTRAVENOUS
  Filled 2021-12-07 (×2): qty 1

## 2021-12-07 MED ORDER — LIDOCAINE 5 % EX PTCH
1.0000 | MEDICATED_PATCH | CUTANEOUS | Status: DC
  Administered 2021-12-07 – 2021-12-13 (×7): 1 via TRANSDERMAL
  Filled 2021-12-07 (×8): qty 1

## 2021-12-07 MED ORDER — ADULT MULTIVITAMIN W/MINERALS CH
1.0000 | ORAL_TABLET | Freq: Every day | ORAL | Status: DC
  Administered 2021-12-07 – 2021-12-14 (×8): 1 via ORAL
  Filled 2021-12-07 (×8): qty 1

## 2021-12-07 MED ORDER — SODIUM CHLORIDE 0.9 % IV SOLN: 500.0000 mg | INTRAVENOUS | Status: DC

## 2021-12-07 MED ORDER — ONDANSETRON HCL 4 MG/2ML IJ SOLN: 4.0000 mg | Freq: Four times a day (QID) | INTRAMUSCULAR | Status: AC | PRN

## 2021-12-07 MED ORDER — SENNOSIDES-DOCUSATE SODIUM 8.6-50 MG PO TABS: 1.0000 | ORAL_TABLET | Freq: Every evening | ORAL | Status: DC | PRN

## 2021-12-07 MED ORDER — LORAZEPAM 1 MG PO TABS
1.0000 mg | ORAL_TABLET | ORAL | Status: AC | PRN
  Administered 2021-12-09 – 2021-12-10 (×3): 1 mg via ORAL
  Filled 2021-12-07 (×3): qty 1

## 2021-12-07 MED ORDER — LORAZEPAM 2 MG/ML IJ SOLN: 1.0000 mg | INTRAMUSCULAR | Status: AC | PRN

## 2021-12-07 MED ORDER — SODIUM CHLORIDE 0.9 % IV BOLUS
1000.0000 mL | Freq: Once | INTRAVENOUS | Status: AC
  Administered 2021-12-07: 1000 mL via INTRAVENOUS

## 2021-12-07 MED ORDER — ALBUTEROL SULFATE (2.5 MG/3ML) 0.083% IN NEBU
2.5000 mg/h | INHALATION_SOLUTION | Freq: Once | RESPIRATORY_TRACT | Status: AC
  Administered 2021-12-07: 2.5 mg/h via RESPIRATORY_TRACT
  Filled 2021-12-07: qty 3

## 2021-12-07 MED ORDER — THIAMINE MONONITRATE 100 MG PO TABS
100.0000 mg | ORAL_TABLET | Freq: Every day | ORAL | Status: DC
  Administered 2021-12-07 – 2021-12-14 (×8): 100 mg via ORAL
  Filled 2021-12-07 (×8): qty 1

## 2021-12-07 MED ORDER — MAGNESIUM SULFATE 2 GM/50ML IV SOLN
2.0000 g | Freq: Once | INTRAVENOUS | Status: AC
  Administered 2021-12-07: 2 g via INTRAVENOUS
  Filled 2021-12-07: qty 50

## 2021-12-07 MED ORDER — DILTIAZEM HCL 60 MG PO TABS
30.0000 mg | ORAL_TABLET | Freq: Once | ORAL | Status: AC
  Administered 2021-12-07: 30 mg via ORAL
  Filled 2021-12-07: qty 1

## 2021-12-07 MED ORDER — ACETAMINOPHEN 325 MG PO TABS: 650.0000 mg | ORAL_TABLET | Freq: Four times a day (QID) | ORAL | Status: AC | PRN

## 2021-12-07 MED ORDER — THIAMINE HCL 100 MG/ML IJ SOLN: 100.0000 mg | Freq: Every day | INTRAMUSCULAR | Status: DC

## 2021-12-07 MED ORDER — METHYLPREDNISOLONE SODIUM SUCC 125 MG IJ SOLR
60.0000 mg | Freq: Once | INTRAMUSCULAR | Status: AC
  Administered 2021-12-07: 60 mg via INTRAVENOUS
  Filled 2021-12-07: qty 2

## 2021-12-07 MED ORDER — NICOTINE 21 MG/24HR TD PT24: 21.0000 mg | MEDICATED_PATCH | Freq: Every day | TRANSDERMAL | Status: DC | PRN

## 2021-12-07 MED ORDER — ACETAMINOPHEN 650 MG RE SUPP: 650.0000 mg | Freq: Four times a day (QID) | RECTAL | Status: AC | PRN

## 2021-12-07 MED ORDER — FOLIC ACID 1 MG PO TABS
1.0000 mg | ORAL_TABLET | Freq: Every day | ORAL | Status: DC
  Administered 2021-12-07 – 2021-12-14 (×8): 1 mg via ORAL
  Filled 2021-12-07 (×8): qty 1

## 2021-12-07 MED ORDER — IPRATROPIUM-ALBUTEROL 0.5-2.5 (3) MG/3ML IN SOLN
3.0000 mL | Freq: Once | RESPIRATORY_TRACT | Status: AC
  Administered 2021-12-07: 3 mL via RESPIRATORY_TRACT
  Filled 2021-12-07: qty 3

## 2021-12-07 MED ORDER — IPRATROPIUM-ALBUTEROL 0.5-2.5 (3) MG/3ML IN SOLN
3.0000 mL | Freq: Four times a day (QID) | RESPIRATORY_TRACT | Status: AC
  Administered 2021-12-07 – 2021-12-08 (×2): 3 mL via RESPIRATORY_TRACT
  Filled 2021-12-07 (×2): qty 3

## 2021-12-07 MED ORDER — SODIUM CHLORIDE 0.9 % IV SOLN
500.0000 mg | Freq: Once | INTRAVENOUS | Status: AC
  Administered 2021-12-07: 500 mg via INTRAVENOUS
  Filled 2021-12-07: qty 5

## 2021-12-07 MED ORDER — ONDANSETRON HCL 4 MG PO TABS: 4.0000 mg | ORAL_TABLET | Freq: Four times a day (QID) | ORAL | Status: AC | PRN

## 2021-12-07 MED ORDER — ENOXAPARIN SODIUM 40 MG/0.4ML IJ SOSY
40.0000 mg | PREFILLED_SYRINGE | INTRAMUSCULAR | Status: DC
  Administered 2021-12-07 – 2021-12-13 (×7): 40 mg via SUBCUTANEOUS
  Filled 2021-12-07 (×7): qty 0.4

## 2021-12-07 NOTE — Assessment & Plan Note (Signed)
-   Patient does not remember a day when he has been without alcohol - Patient's last drink was 12/06/2021 - CIWA protocol initiated

## 2021-12-07 NOTE — Assessment & Plan Note (Signed)
-   Patient is status post Solu-Medrol 60 mg IV one-time dose, 2 doses of DuoNeb lighters, 1 treatment of albuterol nebulizer and 1 dose of magnesium 2 g IV once, per EDP prior to hospital admission - Continue with DuoNebs 4 times daily, 3 doses ordered - Solu-Medrol 40 mg IV twice daily starting on 12/08/2021, 2 doses ordered

## 2021-12-07 NOTE — Hospital Course (Addendum)
Mr. Charles Campos is a 66 year old male with history of tobacco use, hypertension, COPD, alcohol abuse, who presents emergency department for chief concerns of shortness of breath, cough, congestion. Upon arrival in the hospital, she had a significant hypoxemia, was placed on 4 L oxygen. Sodium level   COVID/influenza A/influenza B PCR were negative.  High sensitive troponin was 11 and on repeat was 117.  Patient is placed on IV steroids and antibiotics for COPD exacerbation.

## 2021-12-07 NOTE — ED Triage Notes (Signed)
Patient arrived by EMS with c/o difficulty breathing X1 week. Does not take any medications because he cannot afford them per patient.  EMS vitals: 172/108 b/p 88% RA put on 4L O2 98% 135CBG

## 2021-12-07 NOTE — Assessment & Plan Note (Signed)
-   Patient quit 1 week ago

## 2021-12-07 NOTE — ED Notes (Signed)
MD Malinda informed of sodium 117 critical result called from Lab

## 2021-12-07 NOTE — H&P (Addendum)
History and Physical   Charles Campos AVW:[id number] DOB: 1955-06-20 DOA: 12/07/2021  PCP: Patient, No Pcp Per  Patient coming from: Home  I have personally briefly reviewed patient's old medical records in [place].  Chief Concern: Cough, congestion  HPI: Mr. Charles Campos is a 66 year old male with history of tobacco use, hypertension, COPD, alcohol abuse, who presents emergency department for chief concerns of shortness of breath, cough, congestion.  Initial vitals in the emergency department showed temperature of 98, respiration rate of 23, heart rate of 116, blood pressure 166/108, SPO2 of 94% on 4 L nasal cannula.  Serum sodium is 117, potassium 4.3, chloride 78, bicarb 29, BUN of 12, serum creatinine 0.46, eGFR is > 60, nonfasting glucose 117, WBC 12.3, hemoglobin 16.2, platelets of 194.  COVID/influenza A/influenza B PCR were negative.  High sensitive troponin was 11 and on repeat was 14.  Lactic acid was 1.3 and on repeat was 1.1.  ED treatment: DuoNeb's x2, albuterol nebulizer one-time dose, diltiazem 30 mg p.o. one-time dose, Solu-Medrol 60 mg IV, azithromycin 500 mg, ceftriaxone 1 g IV, magnesium 2 g IV, sodium chloride 1 L bolus. -------------------------------- At bedside, he is able to tell me his name, age, current location, current year. He reports he has been short of breath for about 1 week. He reports it is worse with exertion. He denies chest pain. He endorses bilateral rib pain (L > R) that is worse with palpation and with coughing.  He reports a nonproductive that is worse over the last week.   He reports significant weight loss over the last three months of unknown amount. He reprots his cloths are getting really loose.   He reports poor appetite over the last week. He states food just doesn't taste right. He had one chicken sandwich and he struggled getting it down.   Family history: Brother had colon cancer status post resection and colostomy bag  approximately 5 years ago  Social history: He lives with his brother. He smoked 1 ppd, started at age 36, and he quit last Tuesday due to worsening shortness of breath. He endorses etoh, 6-7 per day. Last drink was yesterday evening. He can not remember the last time he went 3 days without drinking. 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   Healthcare maintenance: Patient has never had a  ROS: Constitutional: no weight change, no fever ENT/Mouth: no sore throat, no rhinorrhea Eyes: no eye pain, no vision changes Cardiovascular: no chest pain, + dyspnea,  no edema, no palpitations Respiratory: no cough, no sputum, + wheezing Gastrointestinal: no nausea, no vomiting, no diarrhea, no constipation Genitourinary: no urinary incontinence, no dysuria, no hematuria Musculoskeletal: no arthralgias, no myalgias Skin: no skin lesions, no pruritus, Neuro: + weakness, no loss of consciousness, no syncope Psych: no anxiety, no depression, + decrease appetite Heme/Lymph: no bruising, no bleeding  ED Course: Discussed with emergency medicine provider, patient requiring hospitalization for chief concerns of COPD exacerbation and hyponatremia.  Assessment/Plan  Principal Problem:   COPD exacerbation (HCC) Active Problems:   Hyponatremia   Acute hypoxemic respiratory failure (HCC)   Alcohol abuse   Tobacco use   Weight loss   Leukocytosis   Assessment and Plan:  * COPD exacerbation ([place]) - Patient is status post Solu-Medrol 60 mg IV one-time dose, 2 doses of DuoNeb lighters, 1 treatment of albuterol nebulizer and 1 dose of magnesium 2 g IV once, per EDP prior to hospital admission - Continue  with DuoNebs 4 times daily, 3 doses ordered - Solu-Medrol 40 mg IV twice daily starting on 12/08/2021, 2 doses ordered  Alcohol abuse - Patient does not remember a day when he has been without alcohol - Patient's last drink was 12/06/2021 - CIWA protocol initiated  Acute hypoxemic  respiratory failure ([place]) - Presumed secondary to COPD exacerbation - At bedside patient desatted to 87 to 88% on 4 L nasal cannula, I increased his O2 supplementation to 5 L nasal cannula with improvement to 91% - Admit to inpatient, progressive cardiac  Hyponatremia - Etiology work-up in progress - Check Legionella antigen in the urine, urine sodium, urine osmolality - Status post sodium chloride 1 L bolus in the ED, no additional sodium chloride will be given by myself on admission - Repeat BMP in a.m.  Leukocytosis - Etiology work-up in progress, check procalcitonin, Legionella, UA  Weight loss - Patient reports an unknown amount however states that it has been significant especially over the last 3 months - Patient has never had a colonoscopy - Counseled patient extensively at bedside that he needs to follow-up with outpatient primary care doctor in order to be referred for a colonoscopy given brothers history of colon cancer - Patient endorses understanding and compliance and I further discussed with his brother over the phone regarding this recommendation with the patient's approval  Tobacco use - Patient quit 1 week ago  Chart reviewed.   DVT prophylaxis: Enoxaparin 40 mg subcutaneous Code Status: Full code Diet: Heart healthy Family Communication: Called brother Mr. Charles Campos over the phone with patient's permission and request Disposition Plan: Pending clinical course Consults called: None at this time Admission status: Inpatient, progressive  Past Medical History:  Diagnosis Date   COPD (chronic obstructive pulmonary disease) ([place])    Past Surgical History:  Procedure Laterality Date   APPENDECTOMY     When he was in 8th grade   Social History:  reports that he has been smoking cigarettes. He has been smoking an average of 1 pack per day. He has never used smokeless tobacco. He reports current alcohol use of about 42.0 standard drinks of alcohol per week. He  reports that he does not use drugs.  No Known Allergies Family History  Problem Relation Age of Onset   Hypertension Mother    Hypertension Brother    Cancer Brother    Family history: Family history reviewed and not pertinent  Prior to Admission medications   Medication Sig Start Date End Date Taking? Authorizing Provider  albuterol (PROVENTIL HFA) 108 (90 Base) MCG/ACT inhaler Inhale 2 puffs into the lungs every 4 (four) hours as needed for wheezing or shortness of breath. 07/08/15   Carrie Mew, MD  azithromycin (ZITHROMAX Z-PAK) 250 MG tablet Take 2 tablets (500 mg) on  Day 1,  followed by 1 tablet (250 mg) once daily on Days 2 through 5. Patient not taking: Reported on 06/07/2018 07/08/15   Carrie Mew, MD  guaiFENesin (ROBITUSSIN) 100 MG/5ML SOLN Take 5 mLs (100 mg total) by mouth every 4 (four) hours as needed for cough or to loosen phlegm. Patient not taking: Reported on 06/07/2018 07/08/15   Carrie Mew, MD  naproxen (NAPROSYN) 500 MG tablet Take 1 tablet (500 mg total) by mouth 2 (two) times daily with a meal. Patient not taking: Reported on 06/07/2018 07/08/15   Carrie Mew, MD  predniSONE (DELTASONE) 20 MG tablet Take 2 tablets (40 mg total) by mouth daily. Patient not taking: Reported on 06/07/2018 07/08/15  Carrie Mew, MD   Physical Exam: Vitals:   12/07/21 1815 12/07/21 1830 12/07/21 1845 12/07/21 1846  BP: (!) 190/96 (!) 185/104 (!) 153/95   Pulse: (!) 115 (!) 116 (!) 120   Resp: (!) 25 (!) 27 (!) 28   Temp:    98.3 F (36.8 C)  TempSrc:    Oral  SpO2: 100% 93% 95%   Weight:       Constitutional: appears frail, cachectic, calm Eyes: PERRL, lids and conjunctivae normal ENMT: Mucous membranes are moist. Posterior pharynx clear of any exudate or lesions. Age-appropriate dentition. Hearing appropriate Neck: normal, supple, no masses, no thyromegaly Respiratory: clear to auscultation bilaterally, no wheezing, no crackles. Normal respiratory  effort. No accessory muscle use.  Oxygen supplementation in place Cardiovascular: Regular rate and rhythm, no murmurs / rubs / gallops. No extremity edema. 2+ pedal pulses. No carotid bruits.  Abdomen: no tenderness, no masses palpated, no hepatosplenomegaly. Bowel sounds positive.  Musculoskeletal: no clubbing / cyanosis. No joint deformity upper and lower extremities. Good ROM, no contractures, no atrophy. Normal muscle tone.  Skin: no rashes, lesions, ulcers. No induration Neurologic: Sensation intact. Strength 5/5 in all 4.  Psychiatric: Normal judgment and insight. Alert and oriented x 3. Normal mood.   EKG: independently reviewed, showing sinus tachycardia with rate of 119, QTc 430  Chest x-ray on Admission: I personally reviewed and I agree with radiologist reading as below.  DG Chest 2 View  Result Date: 12/07/2021 CLINICAL DATA:  Shortness of breath EXAM: CHEST - 2 VIEW COMPARISON:  Radiograph 07/08/2015 FINDINGS: The heart size and mediastinal contours are within normal limits.No focal airspace disease. No pleural effusion or pneumothorax.Thoracic spondylosis. No acute osseous abnormality. IMPRESSION: No evidence of acute cardiopulmonary disease. Electronically Signed   By: Maurine Simmering M.D.   On: 12/07/2021 16:07    Labs on Admission: I have personally reviewed following labs  CBC: Recent Labs  Lab 12/07/21 1523  WBC 12.3*  NEUTROABS 9.7*  HGB 16.2  HCT 44.8  MCV 89.4  PLT 785   Basic Metabolic Panel: Recent Labs  Lab 12/07/21 1523  NA 117*  K 4.3  CL 78*  CO2 29  GLUCOSE 117*  BUN 12  CREATININE 0.46*  CALCIUM 8.8*   GFR: CrCl cannot be calculated (Unknown ideal weight.).  Liver Function Tests: Recent Labs  Lab 12/07/21 1523  AST 28  ALT 26  ALKPHOS 47  BILITOT 1.4*  PROT 6.9  ALBUMIN 3.4*   CRITICAL CARE Performed by: Criss Alvine  Total critical care time: 35 minutes  Critical care time was exclusive of separately billable procedures and  treating other patients.  Critical care was necessary to treat or prevent imminent or life-threatening deterioration.  Acute respiratory failure  Critical care was time spent personally by me on the following activities: development of treatment plan with patient and/or surrogate as well as nursing, discussions with consultants, evaluation of patient's response to treatment, examination of patient, obtaining history from patient or surrogate, ordering and performing treatments and interventions, ordering and review of laboratory studies, ordering and review of radiographic studies, pulse oximetry and re-evaluation of patient's condition.  Dr. Tobie Poet [facility]  If 7PM-7AM, please contact overnight-coverage provider If 7AM-7PM, please contact day coverage provider [email or web address]  12/07/2021, 8:36 PM

## 2021-12-07 NOTE — Assessment & Plan Note (Signed)
-   Etiology work-up in progress, check procalcitonin, Legionella, UA

## 2021-12-07 NOTE — Assessment & Plan Note (Signed)
-   Patient reports an unknown amount however states that it has been significant especially over the last 3 months - Patient has never had a colonoscopy - Counseled patient extensively at bedside that he needs to follow-up with outpatient primary care doctor in order to be referred for a colonoscopy given brothers history of colon cancer - Patient endorses understanding and compliance and I further discussed with his brother over the phone regarding this recommendation with the patient's approval

## 2021-12-07 NOTE — ED Provider Triage Note (Signed)
Emergency Medicine Provider Triage Evaluation Note  Charles Campos, a 66 y.o. male with a history of COPD was evaluated in triage.  Pt complains of SOB with new O2 requirement.  She presents cough, congestion, and runny nose and reports symptoms for the last week.  He arrives via EMS from home, with difficulty breathing.  He does not take any current prescription medications.  He also endorses that he quit smoking last week, after a 40+ years of smoking.   Review of Systems  Positive: SOB Negative: CP, FCS  Physical Exam  BP (!) 166/108   Pulse (!) 116   Temp 98 F (36.7 C) (Oral)   Resp (!) 23   Wt 52.2 kg   SpO2 94%   BMI 18.56 kg/m  Gen:   Awake, no distress   Resp:  Normal effort mild rhonchi bilaterally MSK:   Moves extremities without difficulty  CVS:  Tachy rate  Medical Decision Making  Medically screening exam initiated at 3:27 PM.  Appropriate orders placed.  Carlisle Cater was informed that the remainder of the evaluation will be completed by another provider, this initial triage assessment does not replace that evaluation, and the importance of remaining in the ED until their evaluation is complete.  Patient to the ED for evaluation of shortness of breath for the last week.  Patient with a history of COPD denies any medications.  He presents via EMS from home, noted to have 80% room air sats.  He is now satting at 98% on 4 L by nasal cannula.   Melvenia Needles, PA-C 12/07/21 1531

## 2021-12-07 NOTE — ED Notes (Signed)
EDP and RT notified about respiratory progress/ assessment

## 2021-12-07 NOTE — ED Provider Notes (Signed)
[facility] Provider Note    Event Date/Time   First MD Initiated Contact with Patient 12/07/21 1655     (approximate)   History   Shortness of Breath   HPI  Charles Campos is a 66 y.o. male reports onset of shortness of breath last Tuesday.  He is coughing some but not really coughing anything much up.  He has a history of COPD and thinks this might be COPD exacerbation.  He does not take any blood pressure medicines at home.      Physical Exam   Triage Vital Signs: ED Triage Vitals  Enc Vitals Group     BP 12/07/21 1520 (!) 166/108     Pulse Rate 12/07/21 1520 (!) 116     Resp 12/07/21 1520 (!) 23     Temp 12/07/21 1520 98 F (36.7 C)     Temp Source 12/07/21 1520 Oral     SpO2 12/07/21 1520 94 %     Weight 12/07/21 1518 115 lb (52.2 kg)     Height --      Head Circumference --      Peak Flow --      Pain Score 12/07/21 1518 5     Pain Loc --      Pain Edu? --      Excl. in GC? --     Most recent vital signs: Vitals:   12/07/21 1845 12/07/21 1846  BP: (!) 153/95   Pulse: (!) 120   Resp: (!) 28   Temp:  98.3 F (36.8 C)  SpO2: 95%      General: Awake, alert breathing fast CV:  Good peripheral perfusion.  Heart regular rate and rhythm no audible murmurs but tacky Resp: Increased effort no wheezes slight retractions Abd:  No distention.  Soft and nontender Extremities with no edema   ED Results / Procedures / Treatments   Labs (all labs ordered are listed, but only abnormal results are displayed) Labs Reviewed  COMPREHENSIVE METABOLIC PANEL - Abnormal; Notable for the following components:      Result Value   Sodium 117 (*)    Chloride 78 (*)    Glucose, Bld 117 (*)    Creatinine, Ser 0.46 (*)    Calcium 8.8 (*)    Albumin 3.4 (*)    Total Bilirubin 1.4 (*)    All other components within normal limits  CBC WITH DIFFERENTIAL/PLATELET - Abnormal; Notable for the following components:   WBC 12.3 (*)    MCHC 36.2  (*)    RDW 10.7 (*)    Neutro Abs 9.7 (*)    Lymphs Abs 0.4 (*)    Monocytes Absolute 2.0 (*)    Abs Immature Granulocytes 0.08 (*)    All other components within normal limits  RESP PANEL BY RT-PCR (FLU A&B, COVID) ARPGX2  LACTIC ACID, PLASMA  LACTIC ACID, PLASMA  URINALYSIS, ROUTINE W REFLEX MICROSCOPIC  D-DIMER, QUANTITATIVE (NOT AT [facility])  TROPONIN I (HIGH SENSITIVITY)  TROPONIN I (HIGH SENSITIVITY)     EKG EKG read interpreted by me shows sinus tach at a rate of 119 normal axis possible right atrial enlargement flipped T waves in V6 but not in 1 and L    RADIOLOGY Chest x-ray read by radiology reviewed and interpreted by me shows hyperexpansion no acute infiltrates or other acute problems   PROCEDURES:  Critical Care performed: Critical care time 25 minutes.  This includes talking to the patient and checking  on him several times.  Additionally I reviewed some of the old records.  I am talking to the hospitalist. Procedures   MEDICATIONS ORDERED IN ED: Medications  magnesium sulfate IVPB 2 g 50 mL (has no administration in time range)  methylPREDNISolone sodium succinate (SOLU-MEDROL) 125 mg/2 mL injection 60 mg (has no administration in time range)  albuterol (PROVENTIL) (2.5 MG/3ML) 0.083% nebulizer solution (has no administration in time range)  cefTRIAXone (ROCEPHIN) 1 g in sodium chloride 0.9 % 100 mL IVPB (has no administration in time range)  azithromycin (ZITHROMAX) 500 mg in sodium chloride 0.9 % 250 mL IVPB (has no administration in time range)  sodium chloride 0.9 % bolus 1,000 mL (0 mLs Intravenous Stopped 12/07/21 1822)  ipratropium-albuterol (DUONEB) 0.5-2.5 (3) MG/3ML nebulizer solution 3 mL (3 mLs Nebulization Given 12/07/21 1713)  diltiazem (CARDIZEM) tablet 30 mg (30 mg Oral Given 12/07/21 1712)  ipratropium-albuterol (DUONEB) 0.5-2.5 (3) MG/3ML nebulizer solution 3 mL (3 mLs Nebulization Given 12/07/21 1759)     IMPRESSION / MDM / ASSESSMENT AND  PLAN / ED COURSE  I reviewed the triage vital signs and the nursing notes. ----------------------------------------- 6:45 PM on 12/07/2021 ----------------------------------------- Patient better after 2 doses of Zithromax however he still tachycardic and short of breath.  This is likely a COPD exacerbation.  He does have a bit of an elevated white count and toxic granulations on the differential.  Lactic acid is negative  Differential diagnosis includes, but is not limited to, community-acquired pneumonia bacterial or viral COPD exacerbation. Additionally he is hyponatremic. Patient's presentation is most consistent with acute presentation with potential threat to life or bodily function.  The patient is on the cardiac monitor to evaluate for evidence of arrhythmia and/or significant heart rate changes.  None have been seen    FINAL CLINICAL IMPRESSION(S) / ED DIAGNOSES   Final diagnoses:  COPD exacerbation ([place])  Hyponatremia  Shortness of breath     Rx / DC Orders   ED Discharge Orders     None        Note:  This document was prepared using Dragon voice recognition software and may include unintentional dictation errors.   Nena Polio, MD 12/07/21 2253

## 2021-12-07 NOTE — Assessment & Plan Note (Signed)
-   Presumed secondary to COPD exacerbation - At bedside patient desatted to 68 to 88% on 4 L nasal cannula, I increased his O2 supplementation to 5 L nasal cannula with improvement to 91% - Admit to inpatient, progressive cardiac

## 2021-12-07 NOTE — Assessment & Plan Note (Signed)
-   Etiology work-up in progress - Check Legionella antigen in the urine, urine sodium, urine osmolality - Status post sodium chloride 1 L bolus in the ED, no additional sodium chloride will be given by myself on admission - Repeat BMP in a.m.

## 2021-12-07 NOTE — ED Triage Notes (Signed)
Pt endorses runny nose, congestion, and cough. Pt denies CP.

## 2021-12-08 ENCOUNTER — Encounter: Payer: Self-pay | Admitting: Internal Medicine

## 2021-12-08 ENCOUNTER — Inpatient Hospital Stay: Payer: Self-pay

## 2021-12-08 LAB — URINALYSIS, ROUTINE W REFLEX MICROSCOPIC
Bacteria, UA: NONE SEEN
Bilirubin Urine: NEGATIVE
Glucose, UA: >=500 mg/dL — AB
Hgb urine dipstick: NEGATIVE
Ketones, ur: 20 mg/dL — AB
Leukocytes,Ua: NEGATIVE
Nitrite: NEGATIVE
Protein, ur: NEGATIVE mg/dL
Specific Gravity, Urine: 1.017 (ref 1.005–1.030)
pH: 6 (ref 5.0–8.0)

## 2021-12-08 LAB — HIV ANTIBODY (ROUTINE TESTING W REFLEX): HIV Screen 4th Generation wRfx: NONREACTIVE

## 2021-12-08 LAB — BASIC METABOLIC PANEL
Anion gap: 8 (ref 5–15)
BUN: 15 mg/dL (ref 8–23)
CO2: 28 mmol/L (ref 22–32)
Calcium: 8.6 mg/dL — ABNORMAL LOW (ref 8.9–10.3)
Chloride: 85 mmol/L — ABNORMAL LOW (ref 98–111)
Creatinine, Ser: 0.58 mg/dL — ABNORMAL LOW (ref 0.61–1.24)
GFR, Estimated: >60 mL/min (ref >60)
Glucose, Bld: 196 mg/dL — ABNORMAL HIGH (ref 70–99)
Potassium: 4.3 mmol/L (ref 3.5–5.1)
Sodium: 121 mmol/L — ABNORMAL LOW (ref 135–145)

## 2021-12-08 LAB — CBC
HCT: 38.1 % — ABNORMAL LOW (ref 39.0–52.0)
Hemoglobin: 13.7 g/dL (ref 13.0–17.0)
MCH: 32.3 pg (ref 26.0–34.0)
MCHC: 36 g/dL (ref 30.0–36.0)
MCV: 89.9 fL (ref 80.0–100.0)
Platelets: 204 10*3/uL (ref 150–400)
RBC: 4.24 MIL/uL (ref 4.22–5.81)
RDW: 10.9 % — ABNORMAL LOW (ref 11.5–15.5)
WBC: 13.3 10*3/uL — ABNORMAL HIGH (ref 4.0–10.5)
nRBC: 0 % (ref 0.0–0.2)

## 2021-12-08 LAB — PSA: Prostatic Specific Antigen: 6.81 ng/mL — ABNORMAL HIGH (ref 0.00–4.00)

## 2021-12-08 LAB — PHOSPHORUS: Phosphorus: 3.6 mg/dL (ref 2.5–4.6)

## 2021-12-08 LAB — OSMOLALITY, URINE: Osmolality, Ur: 610 mOsm/kg (ref 300–900)

## 2021-12-08 LAB — MAGNESIUM: Magnesium: 1.7 mg/dL (ref 1.7–2.4)

## 2021-12-08 LAB — BRAIN NATRIURETIC PEPTIDE: B Natriuretic Peptide: 161 pg/mL — ABNORMAL HIGH (ref 0.0–100.0)

## 2021-12-08 LAB — D-DIMER, QUANTITATIVE: D-Dimer, Quant: 4.4 ug/mL-FEU — ABNORMAL HIGH (ref 0.00–0.50)

## 2021-12-08 LAB — SODIUM, URINE, RANDOM: Sodium, Ur: <10 mmol/L

## 2021-12-08 LAB — PROCALCITONIN: Procalcitonin: 0.15 ng/mL

## 2021-12-08 MED ORDER — PREDNISONE 20 MG PO TABS
40.0000 mg | ORAL_TABLET | Freq: Every day | ORAL | Status: DC
  Administered 2021-12-09 – 2021-12-10 (×2): 40 mg via ORAL
  Filled 2021-12-08 (×2): qty 2

## 2021-12-08 MED ORDER — CHLORDIAZEPOXIDE HCL 25 MG PO CAPS
25.0000 mg | ORAL_CAPSULE | Freq: Four times a day (QID) | ORAL | Status: AC
  Administered 2021-12-08 (×4): 25 mg via ORAL
  Filled 2021-12-08 (×4): qty 1

## 2021-12-08 MED ORDER — AMLODIPINE BESYLATE 10 MG PO TABS
10.0000 mg | ORAL_TABLET | Freq: Every day | ORAL | Status: DC
  Administered 2021-12-08 – 2021-12-14 (×7): 10 mg via ORAL
  Filled 2021-12-08: qty 1
  Filled 2021-12-08: qty 2
  Filled 2021-12-08 (×5): qty 1

## 2021-12-08 MED ORDER — IOHEXOL 350 MG/ML SOLN
75.0000 mL | Freq: Once | INTRAVENOUS | Status: AC | PRN
  Administered 2021-12-08: 55 mL via INTRAVENOUS

## 2021-12-08 NOTE — Evaluation (Signed)
Physical Therapy Evaluation Patient Details Name: Charles Campos MRN: [id number] DOB: May 03, 1955 Today's Date: 12/08/2021  History of Present Illness  Pt is a 66 yo male that presented to the ED for SOB, cough, congestion. PMH of tobacco use, HTN, COPD, etoh abuse.   Clinical Impression  Patient alert, agreeable to PT, denied pain. Stated at baseline he is independent for ADLs, IADLs including ambulation, lives with his brother.     The patient demonstrated bed mobility modI. Good sitting balance noted, some generalized BLE weakness with MMT testing. Sit <> stand with supervision from elevated bed surface. He ambulated ~57ft in room with supervision-CGA. Did endorse feeling a bit more unsteady than his usual (distance limited by lines/leads). Pt and PT discussed potential SPC vs RW at discharge, RW provided in room and pt verbalized understanding to use for further mobility attempts.  Overall the patient demonstrated deficits (see "PT Problem List") that impede the patient's functional abilities, safety, and mobility and would benefit from skilled PT intervention. Recommendation at this time is HHPT to maximize safety and return to PLOF.      Recommendations for follow up therapy are one component of a multi-disciplinary discharge planning process, led by the attending physician.  Recommendations may be updated based on patient status, additional functional criteria and insurance authorization.  Follow Up Recommendations Home health PT      Assistance Recommended at Discharge Intermittent Supervision/Assistance  Patient can return home with the following  Assistance with cooking/housework;Assist for transportation;Help with stairs or ramp for entrance    Equipment Recommendations  (SPC or RW, TBD)  Recommendations for Other Services       Functional Status Assessment Patient has had a recent decline in their functional status and demonstrates the ability to make significant  improvements in function in a reasonable and predictable amount of time.     Precautions / Restrictions Precautions Precautions: Fall Restrictions Weight Bearing Restrictions: No      Mobility  Bed Mobility Overal bed mobility: Modified Independent                  Transfers Overall transfer level: Needs assistance Equipment used: None Transfers: Sit to/from Stand Sit to Stand: Supervision, From elevated surface                Ambulation/Gait Ambulation/Gait assistance: Min guard Gait Distance (Feet): 15 Feet Assistive device: None         General Gait Details: limited by lines/leads in ED but no LOB, pt endorsed feeling a bit unsteady compared to baseline  Stairs            Wheelchair Mobility    Modified Rankin (Stroke Patients Only)       Balance Overall balance assessment: Needs assistance Sitting-balance support: Feet supported Sitting balance-Leahy Scale: Good       Standing balance-Leahy Scale: Fair                               Pertinent Vitals/Pain Pain Assessment Pain Assessment: No/denies pain    Home Living Family/patient expects to be discharged to:: Private residence Living Arrangements: Other relatives (brother) Available Help at Discharge: Family Type of Home: House Home Access: Stairs to enter Entrance Stairs-Rails: Right;Left;Can reach both Technical brewer of Steps: 4   Home Layout: One level Home Equipment: None      Prior Function Prior Level of Function : Independent/Modified Independent  Hand Dominance        Extremity/Trunk Assessment   Upper Extremity Assessment Upper Extremity Assessment: Overall WFL for tasks assessed    Lower Extremity Assessment Lower Extremity Assessment: Generalized weakness (grossly 4-/5 bilaterally)       Communication      Cognition Arousal/Alertness: Awake/alert Behavior During Therapy: WFL for tasks  assessed/performed Overall Cognitive Status: Within Functional Limits for tasks assessed                                          General Comments      Exercises     Assessment/Plan    PT Assessment Patient needs continued PT services  PT Problem List Decreased strength;Decreased mobility;Decreased activity tolerance;Decreased balance;Decreased knowledge of use of DME       PT Treatment Interventions DME instruction;Therapeutic exercise;Gait training;Balance training;Stair training;Functional mobility training;Therapeutic activities;Patient/family education    PT Goals (Current goals can be found in the Care Plan section)  Acute Rehab PT Goals Patient Stated Goal: to feel better PT Goal Formulation: With patient Time For Goal Achievement: 12/22/21 Potential to Achieve Goals: Good    Frequency Min 2X/week     Co-evaluation               AM-PAC PT "6 Clicks" Mobility  Outcome Measure Help needed turning from your back to your side while in a flat bed without using bedrails?: None Help needed moving from lying on your back to sitting on the side of a flat bed without using bedrails?: None Help needed moving to and from a bed to a chair (including a wheelchair)?: None Help needed standing up from a chair using your arms (e.g., wheelchair or bedside chair)?: None Help needed to walk in hospital room?: A Little Help needed climbing 3-5 steps with a railing? : A Little 6 Click Score: 22    End of Session Equipment Utilized During Treatment: Oxygen (3L) Activity Tolerance: Patient tolerated treatment well Patient left: in bed;with call bell/phone within reach Nurse Communication: Mobility status PT Visit Diagnosis: Other abnormalities of gait and mobility (R26.89)    Time: 1601-0932 PT Time Calculation (min) (ACUTE ONLY): 14 min   Charges:   PT Evaluation $PT Eval Low Complexity: 1 Low PT Treatments $Therapeutic Activity: 8-22 mins         Olga Coaster PT, DPT 2:10 PM,12/08/21

## 2021-12-08 NOTE — TOC Initial Note (Signed)
Transition of Care [employer] [facility]) - Initial/Assessment Note    Patient Details  Name: Charles Campos MRN: [id number] Date of Birth: 1955/07/27  Transition of Care [facility] CM/SW Contact:    Alberteen Sam, LCSW Phone Number: 12/08/2021, 10:18 AM  Clinical Narrative:                  CSW met with patient at bedside for [facility] consult for substance abuse. Patient denies SA resources at this time. Does confirm not having PCP, CSW has provided patient with [facility] information to establish with a PCP. Patient is agreeable.   No further needs identified at this time.   Expected Discharge Plan: Home/Self Care Barriers to Discharge: Continued Medical Work up   Patient Goals and CMS Choice Patient states their goals for this hospitalization and ongoing recovery are:: to go home CMS Medicare.gov Compare Post Acute Care list provided to:: Patient Choice offered to / list presented to : Patient  Expected Discharge Plan and Services Expected Discharge Plan: Home/Self Care                                              Prior Living Arrangements/Services                       Activities of Daily Living      Permission Sought/Granted                  Emotional Assessment       Orientation: : Oriented to Self, Oriented to Place, Oriented to  Time, Oriented to Situation Alcohol / Substance Use: Not Applicable Psych Involvement: No (comment)  Admission diagnosis:  COPD exacerbation ([place]) [J44.1] Acute hypoxemic respiratory failure ([place]) [J96.01] Patient Active Problem List   Diagnosis Date Noted   COPD exacerbation ([place]) 12/07/2021   Tobacco use 12/07/2021   Hyponatremia 12/07/2021   Acute hypoxemic respiratory failure ([place]) 12/07/2021   Alcohol abuse 12/07/2021   Weight loss 12/07/2021   Leukocytosis 12/07/2021   PCP:  Patient, No Pcp Per Pharmacy:  No Pharmacies Listed    Social Determinants of Health (SDOH) Interventions    Readmission Risk  Interventions     No data to display

## 2021-12-08 NOTE — Progress Notes (Signed)
PROGRESS NOTE    Charles Campos  [id number] DOB: 04-Jun-1955 DOA: 12/07/2021 PCP: Patient, No Pcp Per  Outpatient Specialists: none, doesn't see doctors    Brief Narrative:   Presenting with one week progressively worsening cough and shortness of breath, treating for probable copd exacerbation   Assessment & Plan:   Principal Problem:   COPD exacerbation ([place]) Active Problems:   Hyponatremia   Acute hypoxemic respiratory failure ([place])   Alcohol abuse   Tobacco use   Weight loss   Leukocytosis  # Acute hypoxic respiratory failure Likely 2/2 copd exacerbation. O2 80s in the ED, resolved with [place] o2 - cont  o2, wean as able - given elevated dimer will check cta  # COPD with acute exacerbation Long history of smoking, not under medical care, has had prior respiratory symptoms consistent with copd and is pursed-lip breathing, which wheezing on exam - sputum for culture if able to obtain - CTA as above - continue duonebs, steroids, and start ceftriaxone - f/u bnp - PT eval  # Hyponatremia Chronic, 117 on arrival, 121 this morning with fluids. Suspect multifactorial (poor solute intake from alcoholosism, recent decreased po) - continue fluids for now and monitor - will check am cortisol  # Alcoholism Hx heavy drinking, history of withdrawal.  - ciwa, vitamins - librium taper, will start with librium 25 q6 hours today, consider spacing to q8 tomorrow if good response - TOC consult  # Weight loss Likely 2/2 alcoholism, will need to get utd with cancer screenings - here will check psa, hiv, and hcv - CTA as above will evaluate chest and mediastinum  # Tobacco abuse Last use one week ago, declines patch for now  # Hypertension Likely chronic, not under medical care - start amlodipine - a1c, lipids   DVT prophylaxis: lovenox Code Status: full Family Communication: none @ bedside. Brother updated telephonically 10/17  Level of care: Progressive Status  is: Inpatient Remains inpatient appropriate because: severity of illness    Consultants:  none  Procedures: none  Antimicrobials:  ceftriaxone    Subjective: Reports breathing somewhat improved  Objective: Vitals:   12/08/21 0515 12/08/21 0530 12/08/21 0600 12/08/21 0701  BP: (!) 160/85 (!) 165/84 (!) 162/82   Pulse: 96 95 96   Resp:   18   Temp:    97.7 F (36.5 C)  TempSrc:    Oral  SpO2: 97% 96% 97%   Weight:        Intake/Output Summary (Last 24 hours) at 12/08/2021 0915 Last data filed at 12/07/2021 2241 Gross per 24 hour  Intake 1400 ml  Output --  Net 1400 ml   Filed Weights   12/07/21 1518  Weight: 52.2 kg    Examination:  General exam: Appears calm and comfortable, dissheveled  Respiratory system: wheezing throughout Cardiovascular system: S1 & S2 heard, RRR. No JVD, murmurs, rubs, gallops or clicks. No pedal edema. Gastrointestinal system: Abdomen is nondistended, soft and nontender. No organomegaly or masses felt. Normal bowel sounds heard. Central nervous system: Alert and oriented. No focal neurological deficits. Extremities: Symmetric 5 x 5 power. Skin: No rashes, lesions or ulcers Psychiatry: Judgement and insight appear normal. Mood & affect appropriate.     Data Reviewed: I have personally reviewed following labs and imaging studies  CBC: Recent Labs  Lab 12/07/21 1523 12/08/21 0701  WBC 12.3* 13.3*  NEUTROABS 9.7*  --   HGB 16.2 13.7  HCT 44.8 38.1*  MCV 89.4 89.9  PLT 194 204  Basic Metabolic Panel: Recent Labs  Lab 12/07/21 1523 12/07/21 2330 12/08/21 0701  NA 117*  --  121*  K 4.3  --  4.3  CL 78*  --  85*  CO2 29  --  28  GLUCOSE 117*  --  196*  BUN 12  --  15  CREATININE 0.46*  --  0.58*  CALCIUM 8.8*  --  8.6*  MG  --  1.7  --   PHOS  --  3.6  --    GFR: CrCl cannot be calculated (Unknown ideal weight.). Liver Function Tests: Recent Labs  Lab 12/07/21 1523  AST 28  ALT 26  ALKPHOS 47  BILITOT 1.4*   PROT 6.9  ALBUMIN 3.4*   No results for input(s): "LIPASE", "AMYLASE" in the last 168 hours. No results for input(s): "AMMONIA" in the last 168 hours. Coagulation Profile: No results for input(s): "INR", "PROTIME" in the last 168 hours. Cardiac Enzymes: No results for input(s): "CKTOTAL", "CKMB", "CKMBINDEX", "TROPONINI" in the last 168 hours. BNP (last 3 results) No results for input(s): "PROBNP" in the last 8760 hours. HbA1C: No results for input(s): "HGBA1C" in the last 72 hours. CBG: No results for input(s): "GLUCAP" in the last 168 hours. Lipid Profile: No results for input(s): "CHOL", "HDL", "LDLCALC", "TRIG", "CHOLHDL", "LDLDIRECT" in the last 72 hours. Thyroid Function Tests: No results for input(s): "TSH", "T4TOTAL", "FREET4", "T3FREE", "THYROIDAB" in the last 72 hours. Anemia Panel: No results for input(s): "VITAMINB12", "FOLATE", "FERRITIN", "TIBC", "IRON", "RETICCTPCT" in the last 72 hours. Urine analysis:    Component Value Date/Time   COLORURINE YELLOW (A) 12/08/2021 0701   APPEARANCEUR HAZY (A) 12/08/2021 0701   LABSPEC 1.017 12/08/2021 0701   PHURINE 6.0 12/08/2021 0701   GLUCOSEU >=500 (A) 12/08/2021 0701   HGBUR NEGATIVE 12/08/2021 0701   BILIRUBINUR NEGATIVE 12/08/2021 0701   KETONESUR 20 (A) 12/08/2021 0701   PROTEINUR NEGATIVE 12/08/2021 0701   NITRITE NEGATIVE 12/08/2021 0701   LEUKOCYTESUR NEGATIVE 12/08/2021 0701   Sepsis Labs: @LABRCNTIP (procalcitonin:4,lacticidven:4)  ) Recent Results (from the past 240 hour(s))  Resp Panel by RT-PCR (Flu A&B, Covid) Anterior Nasal Swab     Status: None   Collection Time: 12/07/21  3:30 PM   Specimen: Anterior Nasal Swab  Result Value Ref Range Status   SARS Coronavirus 2 by RT PCR NEGATIVE NEGATIVE Final    Comment: (NOTE) SARS-CoV-2 target nucleic acids are NOT DETECTED.  The SARS-CoV-2 RNA is generally detectable in upper respiratory specimens during the acute phase of infection. The  lowest concentration of SARS-CoV-2 viral copies this assay can detect is 138 copies/mL. A negative result does not preclude SARS-Cov-2 infection and should not be used as the sole basis for treatment or other patient management decisions. A negative result may occur with  improper specimen collection/handling, submission of specimen other than nasopharyngeal swab, presence of viral mutation(s) within the areas targeted by this assay, and inadequate number of viral copies(<138 copies/mL). A negative result must be combined with clinical observations, patient history, and epidemiological information. The expected result is Negative.  Fact Sheet for Patients:  EntrepreneurPulse.com.au  Fact Sheet for Healthcare Providers:  IncredibleEmployment.be  This test is no t yet approved or cleared by the [place] FDA and  has been authorized for detection and/or diagnosis of SARS-CoV-2 by FDA under an Emergency Use Authorization (EUA). This EUA will remain  in effect (meaning this test can be used) for the duration of the COVID-19 declaration under Section 564(b)(1) of the Act,  21 U.S.C.section 360bbb-3(b)(1), unless the authorization is terminated  or revoked sooner.       Influenza A by PCR NEGATIVE NEGATIVE Final   Influenza B by PCR NEGATIVE NEGATIVE Final    Comment: (NOTE) The Xpert Xpress SARS-CoV-2/FLU/RSV plus assay is intended as an aid in the diagnosis of influenza from Nasopharyngeal swab specimens and should not be used as a sole basis for treatment. Nasal washings and aspirates are unacceptable for Xpert Xpress SARS-CoV-2/FLU/RSV testing.  Fact Sheet for Patients: EntrepreneurPulse.com.au  Fact Sheet for Healthcare Providers: IncredibleEmployment.be  This test is not yet approved or cleared by the [place] FDA and has been authorized for detection and/or diagnosis of SARS-CoV-2 by FDA under  an Emergency Use Authorization (EUA). This EUA will remain in effect (meaning this test can be used) for the duration of the COVID-19 declaration under Section 564(b)(1) of the Act, 21 U.S.C. section 360bbb-3(b)(1), unless the authorization is terminated or revoked.  Performed at [facility], [address] [place], [place] [address]          Radiology Studies: DG Chest 2 View  Result Date: 12/07/2021 CLINICAL DATA:  Shortness of breath EXAM: CHEST - 2 VIEW COMPARISON:  Radiograph 07/08/2015 FINDINGS: The heart size and mediastinal contours are within normal limits.No focal airspace disease. No pleural effusion or pneumothorax.Thoracic spondylosis. No acute osseous abnormality. IMPRESSION: No evidence of acute cardiopulmonary disease. Electronically Signed   By: Maurine Simmering M.D.   On: 12/07/2021 16:07        Scheduled Meds:  enoxaparin (LOVENOX) injection  40 mg Subcutaneous H74B   folic acid  1 mg Oral Daily   ipratropium-albuterol  3 mL Nebulization QID   lidocaine  1 patch Transdermal Q24H   methylPREDNISolone (SOLU-MEDROL) injection  40 mg Intravenous BID   multivitamin with minerals  1 tablet Oral Daily   thiamine  100 mg Oral Daily   Or   thiamine  100 mg Intravenous Daily   Continuous Infusions:  azithromycin     cefTRIAXone (ROCEPHIN)  IV       LOS: 1 day     Desma Maxim, MD [facility]   If 7PM-7AM, please contact night-coverage [email or web address] Password [email or web address] 12/08/2021, 9:15 AM

## 2021-12-09 ENCOUNTER — Encounter: Payer: Self-pay | Admitting: Internal Medicine

## 2021-12-09 DIAGNOSIS — J9601 Acute respiratory failure with hypoxia: Secondary | ICD-10-CM

## 2021-12-09 DIAGNOSIS — L8990 Pressure ulcer of unspecified site, unspecified stage: Secondary | ICD-10-CM | POA: Insufficient documentation

## 2021-12-09 DIAGNOSIS — E871 Hypo-osmolality and hyponatremia: Secondary | ICD-10-CM

## 2021-12-09 DIAGNOSIS — F1010 Alcohol abuse, uncomplicated: Secondary | ICD-10-CM

## 2021-12-09 LAB — BASIC METABOLIC PANEL
Anion gap: 7 (ref 5–15)
BUN: 16 mg/dL (ref 8–23)
CO2: 32 mmol/L (ref 22–32)
Calcium: 9 mg/dL (ref 8.9–10.3)
Chloride: 86 mmol/L — ABNORMAL LOW (ref 98–111)
Creatinine, Ser: 0.56 mg/dL — ABNORMAL LOW (ref 0.61–1.24)
GFR, Estimated: >60 mL/min (ref >60)
Glucose, Bld: 152 mg/dL — ABNORMAL HIGH (ref 70–99)
Potassium: 5.1 mmol/L (ref 3.5–5.1)
Sodium: 125 mmol/L — ABNORMAL LOW (ref 135–145)

## 2021-12-09 LAB — CBC
HCT: 38.7 % — ABNORMAL LOW (ref 39.0–52.0)
Hemoglobin: 13.8 g/dL (ref 13.0–17.0)
MCH: 33.5 pg (ref 26.0–34.0)
MCHC: 35.7 g/dL (ref 30.0–36.0)
MCV: 93.9 fL (ref 80.0–100.0)
Platelets: 252 10*3/uL (ref 150–400)
RBC: 4.12 MIL/uL — ABNORMAL LOW (ref 4.22–5.81)
RDW: 10.9 % — ABNORMAL LOW (ref 11.5–15.5)
WBC: 21.3 10*3/uL — ABNORMAL HIGH (ref 4.0–10.5)
nRBC: 0 % (ref 0.0–0.2)

## 2021-12-09 LAB — LIPID PANEL
Cholesterol: 153 mg/dL (ref 0–200)
HDL: 63 mg/dL (ref >40)
LDL Cholesterol: 72 mg/dL (ref 0–99)
Total CHOL/HDL Ratio: 2.4 RATIO
Triglycerides: 89 mg/dL (ref <150)
VLDL: 18 mg/dL (ref 0–40)

## 2021-12-09 LAB — HEMOGLOBIN A1C
Hgb A1c MFr Bld: 5.4 % (ref 4.8–5.6)
Mean Plasma Glucose: 108.28 mg/dL

## 2021-12-09 LAB — CORTISOL-AM, BLOOD: Cortisol - AM: 7.5 ug/dL (ref 6.7–22.6)

## 2021-12-09 MED ORDER — ENSURE ENLIVE PO LIQD
237.0000 mL | Freq: Two times a day (BID) | ORAL | Status: DC
  Administered 2021-12-10 – 2021-12-14 (×5): 237 mL via ORAL

## 2021-12-09 MED ORDER — SODIUM CHLORIDE 1 G PO TABS
1.0000 g | ORAL_TABLET | Freq: Two times a day (BID) | ORAL | Status: DC
  Administered 2021-12-10 – 2021-12-14 (×9): 1 g via ORAL
  Filled 2021-12-09 (×9): qty 1

## 2021-12-09 MED ORDER — IPRATROPIUM-ALBUTEROL 0.5-2.5 (3) MG/3ML IN SOLN
3.0000 mL | Freq: Four times a day (QID) | RESPIRATORY_TRACT | Status: DC
  Administered 2021-12-09 – 2021-12-12 (×11): 3 mL via RESPIRATORY_TRACT
  Filled 2021-12-09 (×11): qty 3

## 2021-12-09 NOTE — Progress Notes (Signed)
Patient arrived to the unit from ED via wheelchair. No complaints at this time.

## 2021-12-09 NOTE — Progress Notes (Addendum)
  Progress Note   Patient: Charles Campos HFW:[id number] DOB: [birth year] DOA: 12/07/2021     2 DOS: the patient was seen and examined on 12/09/2021   Brief hospital course: Mr. Key Cen is a 66 year old male with history of tobacco use, hypertension, COPD, alcohol abuse, who presents emergency department for chief concerns of shortness of breath, cough, congestion. Upon arrival in the hospital, she had a significant hypoxemia, was placed on 4 L oxygen. Sodium level   COVID/influenza A/influenza B PCR were negative.  High sensitive troponin was 11 and on repeat was 117.  Patient is placed on IV steroids and antibiotics for COPD exacerbation.  Assessment and Plan: Acute hypoxemic respiratory failure. COPD exacerbation. Patient still has significant bronchospasm, continue prednisone.  He is also on Rocephin, will continue for now. Schedule bronchodilator.  Dysphagia. Patient has been experiencing dysphagia, but no evidence of aspiration pneumonia.  Will obtain speech therapy evaluation.  Hyponatremia Chronic alcohol abuse Sodium level gradually going up, will continue to follow. Sodium chloride tablets 1 g twice a day. Continue CIWA protocol.  Moderate protein calorie malnutrition. Weight loss. Patient has BMI of 18.31, he was losing weight, he appears to be malnourished.  Continue to follow with added Ensure.  Essential hypertension.  Continue to follow.       Subjective:  Patient still has signal short of breath with exertion, cough, nonproductive.  He also complained of dysphagia.  No nausea vomiting.  Physical Exam: Vitals:   12/09/21 0946 12/09/21 1018 12/09/21 1115 12/09/21 1307  BP: (!) 142/81  (!) 154/95 136/77  Pulse: (!) 112  (!) 111 (!) 102  Resp:   18 18  Temp: 97.9 F (36.6 C)  98 F (36.7 C) 98.6 F (37 C)  TempSrc: Oral     SpO2: 100%  99% 95%  Weight:  49.9 kg    Height:  5\' 5"  (1.651 m)     General exam: Appears calm and comfortable,  appears malnourished. Respiratory system: Decreased breath sounds with wheezes. Respiratory effort normal. Cardiovascular system: S1 & S2 heard, RRR. No JVD, murmurs, rubs, gallops or clicks. No pedal edema. Gastrointestinal system: Abdomen is nondistended, soft and nontender. No organomegaly or masses felt. Normal bowel sounds heard. Central nervous system: Alert and oriented. No focal neurological deficits. Extremities: Symmetric 5 x 5 power. Skin: No rashes, lesions or ulcers Psychiatry: Judgement and insight appear normal. Mood & affect appropriate. ' Data Reviewed:  CT chest and chest x-ray reviewed.  Lab results reviewed.  Family Communication:   Disposition: Status is: Inpatient Remains inpatient appropriate because: Severity of disease, IV treatment.  Planned Discharge Destination: Home    Time spent: 35 minutes  Author: Sharen Hones, MD 12/09/2021 5:09 PM  For on call review [email or web address].

## 2021-12-09 NOTE — Evaluation (Signed)
Occupational Therapy Evaluation Patient Details Name: Charles Campos MRN: [id number] DOB: 09-17-1955 Today's Date: 12/09/2021   History of Present Illness Pt is a 66 yo male that presented to the ED for SOB, cough, congestion. PMH of tobacco use, HTN, COPD, etoh abuse.   Clinical Impression   Patient seen for OT evaluation. Patient presenting with decreased independence in self care, balance, functional mobility/transfers, and endurance. At baseline, pt is independent with ADLs, IADLs, and functional mobility without an AD. Pt currently functioning at Mod I for bed mobility, supervision for simulated toilet transfer, and set up A for seated LB dressing. Pt educated in energy conservation strategies including pursed lip breathing, activity pacing, home/routines modifications, work simplification, AE/DME, prioritizing of meaningful occupations, and falls prevention. Patient will benefit from acute OT to increase overall independence in the areas of ADLs and functional mobility in order to safely discharge home. Pt could benefit from [facility] following D/C to decrease falls risk, improve balance, and maximize independence in self-care within own home environment.    Recommendations for follow up therapy are one component of a multi-disciplinary discharge planning process, led by the attending physician.  Recommendations may be updated based on patient status, additional functional criteria and insurance authorization.   Follow Up Recommendations  Home health OT    Assistance Recommended at Discharge Intermittent Supervision/Assistance  Patient can return home with the following Assistance with cooking/housework;Assist for transportation;Help with stairs or ramp for entrance    Functional Status Assessment  Patient has had a recent decline in their functional status and demonstrates the ability to make significant improvements in function in a reasonable and predictable amount of time.  Equipment  Recommendations  None recommended by OT    Recommendations for Other Services       Precautions / Restrictions Precautions Precautions: Fall Restrictions Weight Bearing Restrictions: No      Mobility Bed Mobility Overal bed mobility: Modified Independent                  Transfers Overall transfer level: Needs assistance Equipment used: Rolling walker (2 wheels) Transfers: Sit to/from Stand Sit to Stand: Supervision                  Balance Overall balance assessment: Needs assistance Sitting-balance support: Feet supported Sitting balance-Leahy Scale: Good     Standing balance support: Bilateral upper extremity supported Standing balance-Leahy Scale: Fair                             ADL either performed or assessed with clinical judgement   ADL Overall ADL's : Needs assistance/impaired Eating/Feeding: Set up;Sitting   Grooming: Wash/dry face;Set up;Sitting               Lower Body Dressing: Set up;Sitting/lateral leans   Toilet Transfer: Supervision/safety;Rolling walker (2 wheels) Toilet Transfer Details (indicate cue type and reason): simulated with STS from EOB         Functional mobility during ADLs: Min guard;Rolling walker (2 wheels) (ambulated in hallway)       Vision Baseline Vision/History: 1 Wears glasses Patient Visual Report: No change from baseline       Perception     Praxis      Pertinent Vitals/Pain Pain Assessment Pain Assessment: No/denies pain     Hand Dominance     Extremity/Trunk Assessment Upper Extremity Assessment Upper Extremity Assessment: Overall WFL for tasks assessed   Lower Extremity Assessment Lower  Extremity Assessment: Generalized weakness       Communication Communication Communication: No difficulties   Cognition Arousal/Alertness: Awake/alert Behavior During Therapy: WFL for tasks assessed/performed Overall Cognitive Status: Within Functional Limits for tasks  assessed                                 General Comments: A&Ox3 (unable to state place/name of hospital)     General Comments  SpO2 desatting to 81% on 3L with activity likely due to poor pleth, returned to 97% quickly with seated rest break    Exercises Other Exercises Other Exercises: OT provided education re: role of OT, OT POC, post acute recs, sitting up for all meals, EOB/OOB mobility with assistance, home/fall safety, energy conservation techniques, pursed lip breathing     Shoulder Instructions      Home Living Family/patient expects to be discharged to:: Private residence Living Arrangements: Other relatives (brother) Available Help at Discharge: Family Type of Home: House Home Access: Stairs to enter Secretary/administrator of Steps: 4 Entrance Stairs-Rails: Right;Left;Can reach both Home Layout: One level     Bathroom Shower/Tub: Chief Strategy Officer: Standard     Home Equipment: None   Additional Comments: Pt reports self and brother are both retired      Prior Functioning/Environment Prior Level of Function : Independent/Modified Independent;Driving             Mobility Comments: 1 fall past 6 months ADLs Comments: Pt reports independent with ADLs/IADLs.        OT Problem List: Cardiopulmonary status limiting activity;Impaired balance (sitting and/or standing);Decreased activity tolerance;Decreased strength      OT Treatment/Interventions: Self-care/ADL training;Patient/family education;Therapeutic exercise;Balance training;Energy conservation;Therapeutic activities;DME and/or AE instruction    OT Goals(Current goals can be found in the care plan section) Acute Rehab OT Goals Patient Stated Goal: go home OT Goal Formulation: With patient Time For Goal Achievement: 12/23/21 Potential to Achieve Goals: Good ADL Goals Pt Will Perform Grooming: standing;with modified independence Pt Will Perform Lower Body Dressing:  with modified independence;sit to/from stand Pt Will Transfer to Toilet: with modified independence;ambulating;regular height toilet Pt Will Perform Toileting - Clothing Manipulation and hygiene: with modified independence;sit to/from stand Additional ADL Goal #1: Pt will verbalize 2-3 energy conservation techniques with Min VC for improved activity tolerance with ADLs  OT Frequency: Min 2X/week    Co-evaluation              AM-PAC OT "6 Clicks" Daily Activity     Outcome Measure Help from another person eating meals?: None Help from another person taking care of personal grooming?: A Little Help from another person toileting, which includes using toliet, bedpan, or urinal?: A Little Help from another person bathing (including washing, rinsing, drying)?: A Little Help from another person to put on and taking off regular upper body clothing?: None Help from another person to put on and taking off regular lower body clothing?: A Little 6 Click Score: 20   End of Session Equipment Utilized During Treatment: Gait belt;Rolling walker (2 wheels);Oxygen (3L O2 via [place]) Nurse Communication: Mobility status  Activity Tolerance: Patient tolerated treatment well;Patient limited by fatigue Patient left: in bed;with call bell/phone within reach;with bed alarm set  OT Visit Diagnosis: Unsteadiness on feet (R26.81);History of falling (Z91.81);Muscle weakness (generalized) (M62.81)                Time: 6659-9357 OT Time Calculation (min):  31 min Charges:  OT General Charges $OT Visit: 1 Visit OT Evaluation $OT Eval Low Complexity: 1 Low  [facility] MS, OTR/L ascom [phone]  12/09/21, 2:21 PM

## 2021-12-09 NOTE — Progress Notes (Signed)
Physical Therapy Treatment Patient Details Name: Charles Campos MRN: [id number] DOB: [birth year] Today's Date: 12/09/2021   History of Present Illness Pt is a 66 yo male that presented to the ED for SOB, cough, congestion. PMH of tobacco use, HTN, COPD, etoh abuse.    PT Comments    Patient alert, agreeable to PT. Denied pain but did appear fatigued. Bed mobility modI, and sit <> stand with RW and supervision, cued for hand placement. He was able to ambulate ~88ft with RW. Very slow, decreased velocity but no LOB. Returned to supine with needs in reach. Pt educated on continued mobility and importance of mobility, pt verbalized understanding. The patient would benefit from further skilled PT intervention to continue to progress towards goals. Recommendation remains appropriate.       Recommendations for follow up therapy are one component of a multi-disciplinary discharge planning process, led by the attending physician.  Recommendations may be updated based on patient status, additional functional criteria and insurance authorization.  Follow Up Recommendations  Home health PT     Assistance Recommended at Discharge Intermittent Supervision/Assistance  Patient can return home with the following Assistance with cooking/housework;Assist for transportation;Help with stairs or ramp for entrance   Equipment Recommendations   (SPC or RW, TBD)    Recommendations for Other Services       Precautions / Restrictions Precautions Precautions: Fall Restrictions Weight Bearing Restrictions: No     Mobility  Bed Mobility Overal bed mobility: Modified Independent                  Transfers Overall transfer level: Needs assistance Equipment used: Rolling walker (2 wheels) Transfers: Sit to/from Stand             General transfer comment: cued for hand placement    Ambulation/Gait Ambulation/Gait assistance: Min guard Gait Distance (Feet): 35 Feet Assistive device:  Rolling walker (2 wheels)         General Gait Details: very slow, decreased cadence but no LOB   Stairs             Wheelchair Mobility    Modified Rankin (Stroke Patients Only)       Balance Overall balance assessment: Needs assistance Sitting-balance support: Feet supported Sitting balance-Leahy Scale: Good       Standing balance-Leahy Scale: Fair                              Cognition Arousal/Alertness: Awake/alert Behavior During Therapy: WFL for tasks assessed/performed Overall Cognitive Status: Within Functional Limits for tasks assessed                                          Exercises      General Comments General comments (skin integrity, edema, etc.): pt on room air ([place] out of nose) at 84%. on 3L > 90% or greater      Pertinent Vitals/Pain Pain Assessment Pain Assessment: No/denies pain    Home Living Family/patient expects to be discharged to:: Private residence Living Arrangements: Other relatives (brother) Available Help at Discharge: Family Type of Home: House Home Access: Stairs to enter Entrance Stairs-Rails: Right;Left;Can reach both Technical brewer of Steps: 4   Home Layout: One level Home Equipment: None Additional Comments: Pt reports self and brother are both retired    Prior [employer]  PT Goals (current goals can now be found in the care plan section) Progress towards PT goals: Progressing toward goals    Frequency    Min 2X/week      PT Plan Current plan remains appropriate    Co-evaluation              AM-PAC PT "6 Clicks" Mobility   Outcome Measure  Help needed turning from your back to your side while in a flat bed without using bedrails?: None Help needed moving from lying on your back to sitting on the side of a flat bed without using bedrails?: None Help needed moving to and from a bed to a chair (including a wheelchair)?: None Help needed standing  up from a chair using your arms (e.g., wheelchair or bedside chair)?: None Help needed to walk in hospital room?: A Little Help needed climbing 3-5 steps with a railing? : A Little 6 Click Score: 22    End of Session Equipment Utilized During Treatment: Oxygen (3L) Activity Tolerance: Patient tolerated treatment well Patient left: in bed;with call bell/phone within reach Nurse Communication: Mobility status PT Visit Diagnosis: Other abnormalities of gait and mobility (R26.89)     Time: 3354-5625 PT Time Calculation (min) (ACUTE ONLY): 15 min  Charges:  $Therapeutic Activity: 8-22 mins                     Olga Coaster PT, DPT 4:14 PM,12/09/21

## 2021-12-09 NOTE — Progress Notes (Signed)
   12/09/21 2059  Assess: MEWS Score  Temp 98.4 F (36.9 C)  BP 128/75  MAP (mmHg) 90  Pulse Rate (!) 111  SpO2 99 %  O2 Device Nasal Cannula  O2 Flow Rate (L/min) 3 L/min  Assess: MEWS Score  MEWS Temp 0  MEWS Systolic 0  MEWS Pulse 2  MEWS RR 0  MEWS LOC 0  MEWS Score 2  MEWS Score Color Yellow  Assess: if the MEWS score is Yellow or Red  Were vital signs taken at a resting state? Yes  Focused Assessment No change from prior assessment  Does the patient meet 2 or more of the SIRS criteria? Yes  Does the patient have a confirmed or suspected source of infection? No  MEWS guidelines implemented *See Row Information* No, vital signs rechecked  Treat  MEWS Interventions Administered prn meds/treatments  Pain Scale 0-10  Pain Score 0  Notify: Charge Nurse/RN  Name of Charge Nurse/RN Notified Alisa, RN  Date Charge Nurse/RN Notified 12/09/21  Time Charge Nurse/RN Notified 2110  Assess: SIRS CRITERIA  SIRS Temperature  0  SIRS Pulse 1  SIRS Respirations  0  SIRS WBC 1  SIRS Score Sum  2   No change in pt condition, no signs of acute distress, pt had yellow MEWS during the day and provider was notified. Will continue to monitor patient closely.

## 2021-12-10 ENCOUNTER — Inpatient Hospital Stay: Payer: Self-pay

## 2021-12-10 LAB — CBC
HCT: 32.7 % — ABNORMAL LOW (ref 39.0–52.0)
Hemoglobin: 11.2 g/dL — ABNORMAL LOW (ref 13.0–17.0)
MCH: 32.6 pg (ref 26.0–34.0)
MCHC: 34.3 g/dL (ref 30.0–36.0)
MCV: 95.1 fL (ref 80.0–100.0)
Platelets: 270 10*3/uL (ref 150–400)
RBC: 3.44 MIL/uL — ABNORMAL LOW (ref 4.22–5.81)
RDW: 10.8 % — ABNORMAL LOW (ref 11.5–15.5)
WBC: 20.3 10*3/uL — ABNORMAL HIGH (ref 4.0–10.5)
nRBC: 0 % (ref 0.0–0.2)

## 2021-12-10 LAB — BASIC METABOLIC PANEL
Anion gap: 3 — ABNORMAL LOW (ref 5–15)
BUN: 22 mg/dL (ref 8–23)
CO2: 34 mmol/L — ABNORMAL HIGH (ref 22–32)
Calcium: 8.4 mg/dL — ABNORMAL LOW (ref 8.9–10.3)
Chloride: 91 mmol/L — ABNORMAL LOW (ref 98–111)
Creatinine, Ser: 0.5 mg/dL — ABNORMAL LOW (ref 0.61–1.24)
GFR, Estimated: >60 mL/min (ref >60)
Glucose, Bld: 139 mg/dL — ABNORMAL HIGH (ref 70–99)
Potassium: 5 mmol/L (ref 3.5–5.1)
Sodium: 128 mmol/L — ABNORMAL LOW (ref 135–145)

## 2021-12-10 MED ORDER — METHYLPREDNISOLONE SODIUM SUCC 125 MG IJ SOLR
80.0000 mg | INTRAMUSCULAR | Status: DC
  Administered 2021-12-10: 80 mg via INTRAVENOUS
  Filled 2021-12-10: qty 2

## 2021-12-10 NOTE — Progress Notes (Signed)
Mobility Specialist - Progress Note   12/10/21 1305  Mobility  Activity Ambulated with assistance in room;Ambulated with assistance to bathroom;Stood at bedside;Dangled on edge of bed  Level of Assistance Standby assist, set-up cues, supervision of patient - no hands on  Assistive Device Front wheel walker  Distance Ambulated (ft) 10 ft  Activity Response Tolerated well  $Mobility charge 1 Mobility   Pt supine in bed on 3L upon arrival. Pt STS and ambulates to restroom SBA. Pt has on LOB during session but slow gait throughout. Pt is left in recliner with needs in reach and chair alarm on.   Gretchen Short  Mobility Specialist  12/10/21 1:07 PM

## 2021-12-10 NOTE — Progress Notes (Signed)
Made Dr. Roosevelt Locks aware patient has increased wheezing, heart rate 115-120. Per md okay to give scheduled breathing treatment now and ordered iv solumedrol.

## 2021-12-10 NOTE — Progress Notes (Signed)
OT Cancellation Note  Patient Details Name: Marke Goodwyn MRN: [id number] DOB: 17-Sep-1955   Cancelled Treatment:    Reason Eval/Treat Not Completed: Fatigue/lethargy limiting ability to participate. Patient briefly woke up and spoke to OT, but then fell back asleep; unable to participate meaningfully today. OT left energy conservation handout on pt's bedside table for future session.   Waymon Amato, MS, OTR/L  Vania Rea 12/10/2021, 3:53 PM

## 2021-12-10 NOTE — Progress Notes (Signed)
Per Dr. Roosevelt Locks barium swallow looks okay. Can put diet order back for heart healthy

## 2021-12-10 NOTE — Evaluation (Signed)
Clinical/Bedside Swallow Evaluation Patient Details  Name: Charles Campos MRN: [id number] Date of Birth: 28-Dec-1955  Today's Date: 12/10/2021 Time: SLP Start Time (ACUTE ONLY): 36 SLP Stop Time (ACUTE ONLY): 1111 SLP Time Calculation (min) (ACUTE ONLY): 21 min  Past Medical History:  Past Medical History:  Diagnosis Date   COPD (chronic obstructive pulmonary disease) ([place])    Past Surgical History:  Past Surgical History:  Procedure Laterality Date   APPENDECTOMY     When he was in 8th grade   HPI:  Mr. Charles Campos is a 66 year old male with history of tobacco use, hypertension, COPD, alcohol abuse, who presents emergency department for chief concerns of shortness of breath, cough, congestion. Pt currently on a regular solids and thin liquids diet with 2L nasal canula. CXR 12/07/21: No evidence of acute cardiopulmonary disease. CT Angio Chest: Numerous small irregular pulmonary nodules, some with cavitation seen primarily in the upper lungs, with centrilobular and tree in bud configuration. 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 Some irregular nodules have a tree-in-bud configuration suggesting a superimposed airways-centered infection. Bronchial wall thickening which could be related to acute bronchitis or COPD. Recommend follow-up chest CT in 3 months. No evidence of pulmonary embolism. Prominent right hilar lymph node, likely reactive.    Assessment / Plan / Recommendation  Clinical Impression  Pt seen for clinical swallow assessment in the setting of pt report of "difficulty with swallowing." Pt sitting upright, on 2L O2 upon therapist entrance into room. Pt with congested cough and labored, wheezy breath quality in the absence of PO intake. Pt completing trials from breakfast tray during assessment. Cough persisted t/o intake, though not timed with PO intake. Oral phase mildly prolonged,  secondary to edentulous nature, though pt consistently achieving oral clearance. Belching noted during intake, suggesting esophageal component to swallow dysfunction. Pt did not identify a particular pattern or consistency for "difficulty with intake," no reported challenge with medication administration. Further pt endorsed minimal appetite, contributing to recent weight loss.       Pt is at increased of aspiration precautions based on current deconditioning, respiratory status, and baseline challenge with PO intake; therefore, recommend aspiration precautions (slow rate, small bites, elevated HOB, and alert for PO intake). Continue with regular solids and thin liquids and medications whole with liquids. MD reporting plan to complete barium swallow assessment given concern for esophageal component. SLP will follow up for aspiration precaution training and monitor for stability for respiratory function. MD and RN aware and in agreement with plan. SLP Visit Diagnosis: Dysphagia, unspecified (R13.10)    Aspiration Risk  Mild aspiration risk    Diet Recommendation   Regular solids and thin liquids  Medication Administration: Whole meds with liquid    Other  Recommendations Oral Care Recommendations: Oral care BID    Recommendations for follow up therapy are one component of a multi-disciplinary discharge planning process, led by the attending physician.  Recommendations may be updated based on patient status, additional functional criteria and insurance authorization.  Follow up Recommendations Other (comment) (TBD)      Assistance Recommended at Discharge Set up Supervision/Assistance  Functional Status Assessment Patient has had a recent decline in their functional status and demonstrates the ability to make significant improvements in function in a reasonable and predictable amount of time.  Frequency and Duration min 2x/week  1 week       Prognosis Prognosis for Safe Diet Advancement:  Good  Swallow Study   General Date of Onset: 12/10/21 HPI: Mr. Charles Campos is a 66 year old male with history of tobacco use, hypertension, COPD, alcohol abuse, who presents emergency department for chief concerns of shortness of breath, cough, congestion. Pt currently on a regular solids and thin liquids diet with 2L nasal canula. CXR 12/07/21: No evidence of acute cardiopulmonary disease. CT Angio Chest: Numerous small irregular pulmonary nodules, some with cavitation seen primarily in the upper lungs, with centrilobular and tree in bud configuration. 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 Some irregular nodules have a tree-in-bud configuration suggesting a superimposed airways-centered infection. Bronchial wall thickening which could be related to acute bronchitis or COPD. Recommend follow-up chest CT in 3 months. No evidence of pulmonary embolism. Prominent right hilar lymph node, likely reactive. Type of Study: Bedside Swallow Evaluation Previous Swallow Assessment: none in chart Diet Prior to this Study: Regular;Thin liquids Temperature Spikes Noted: No (Temp- 99.6; WBC 20.3) Respiratory Status: Nasal cannula (2L) History of Recent Intubation: No Behavior/Cognition: Alert;Lethargic/Drowsy Oral Cavity Assessment: Within Functional Limits Oral Care Completed by SLP: Recent completion by staff Oral Cavity - Dentition: Edentulous;Other (Comment) (dentures at home- pt denied use for intake) Vision: Functional for self-feeding Self-Feeding Abilities: Able to feed self;Needs set up Patient Positioning: Upright in bed Baseline Vocal Quality: Low vocal intensity;Other (comment) (congested) Volitional Cough: Strong;Congested Volitional Swallow: Unable to elicit    Oral/Motor/Sensory Function Overall Oral Motor/Sensory Function: Within functional limits   Ice Chips Ice chips: Not tested   Thin Liquid  Thin Liquid: Within functional limits    Nectar Thick Nectar Thick Liquid: Not tested   Honey Thick Honey Thick Liquid: Not tested   Puree Puree: Within functional limits   Solid     Solid: Within functional limits     [place] Braylon Grenda Clapp MS CCC-SLP  [place] J Clapp 12/10/2021,11:16 AM

## 2021-12-10 NOTE — Progress Notes (Signed)
  Progress Note   Patient: Charles Campos URK:[id number] DOB: 1956/02/10 DOA: 12/07/2021     3 DOS: the patient was seen and examined on 12/10/2021   Brief hospital course: Mr. Charles Campos is a 66 year old male with history of tobacco use, hypertension, COPD, alcohol abuse, who presents emergency department for chief concerns of shortness of breath, cough, congestion. Upon arrival in the hospital, she had a significant hypoxemia, was placed on 4 L oxygen. Sodium level   COVID/influenza A/influenza B PCR were negative.  High sensitive troponin was 11 and on repeat was 117.  Patient is placed on IV steroids and antibiotics for COPD exacerbation.  Assessment and Plan: Acute hypoxemic respiratory failure. COPD exacerbation. Continue steroids and scheduled bronchodilators.  Oxygenation is better.   Dysphagia. Discussed with speech therapist, most likely esophageal phase of dysphagia, will obtain barium esophagram.   Hyponatremia Chronic alcohol abuse Sodium level continue to improve on salt tablets. Continue CIWA protocol.   Moderate protein calorie malnutrition. Weight loss. Patient has BMI of 18.31, he was losing weight, he appears to be malnourished.  Continue Ensure.   Essential hypertension.  Continue to follow.   Pressure ulcer POA. Pressure Injury 12/09/21 Coccyx Mid Stage 1 -  Intact skin with non-blanchable redness of a localized area usually over a bony prominence. (Active)  12/09/21 1000  Location: Coccyx  Location Orientation: Mid  Staging: Stage 1 -  Intact skin with non-blanchable redness of a localized area usually over a bony prominence.  Wound Description (Comments):   Present on Admission: Yes         Subjective:  Patient still has signal short of breath with exertion, cough, nonproductive. Still complaining dysphagia    Physical Exam: Vitals:   12/10/21 0410 12/10/21 0604 12/10/21 0723 12/10/21 1121  BP: 138/64 135/78 113/67 134/72  Pulse: (!)  116 (!) 104 100 (!) 122  Resp: 16  16 19   Temp: 99.6 F (37.6 C)  98.1 F (36.7 C) 98.3 F (36.8 C)  TempSrc: Oral  Axillary   SpO2: 93%  99% 93%  Weight:      Height:       General exam: Appears calm and comfortable  Respiratory system: Decreased breath sounds. Respiratory effort normal. Cardiovascular system: S1 & S2 heard, RRR. No JVD, murmurs, rubs, gallops or clicks. No pedal edema. Gastrointestinal system: Abdomen is nondistended, soft and nontender. No organomegaly or masses felt. Normal bowel sounds heard. Central nervous system: Alert and oriented. No focal neurological deficits. Extremities: Symmetric 5 x 5 power. Skin: No rashes, lesions or ulcers Psychiatry:  Mood & affect appropriate.   Data Reviewed:  Lab results reviewed.  Family Communication:   Disposition: Status is: Inpatient Remains inpatient appropriate because: Severity of disease,  Planned Discharge Destination: Home    Time spent: 35 minutes  Author: Sharen Hones, MD 12/10/2021 11:22 AM  For on call review [email or web address].

## 2021-12-10 NOTE — TOC Progression Note (Signed)
Transition of Care [facility]) - Progression Note    Patient Details  Name: Charles Campos MRN: [id number] Date of Birth: 1955/03/19  Transition of Care [facility]) CM/SW [place], LCSW Phone Number: 12/10/2021, 8:09 AM  Clinical Narrative:  Patient does not have a PCP or insurance. Would not be able to arrange charity home health until he has established care. Will follow for DME needs. PT considering SPC vs RW. Patient is on 3 L acute oxygen.    Expected Discharge Plan: Home/Self Care Barriers to Discharge: Continued Medical Work up  Expected Discharge Plan and Services Expected Discharge Plan: Home/Self Care                                               Social Determinants of Health (SDOH) Interventions    Readmission Risk Interventions     No data to display

## 2021-12-11 LAB — HCV INTERPRETATION

## 2021-12-11 LAB — BASIC METABOLIC PANEL
Anion gap: 6 (ref 5–15)
BUN: 20 mg/dL (ref 8–23)
CO2: 37 mmol/L — ABNORMAL HIGH (ref 22–32)
Calcium: 8.3 mg/dL — ABNORMAL LOW (ref 8.9–10.3)
Chloride: 88 mmol/L — ABNORMAL LOW (ref 98–111)
Creatinine, Ser: 0.43 mg/dL — ABNORMAL LOW (ref 0.61–1.24)
GFR, Estimated: >60 mL/min (ref >60)
Glucose, Bld: 168 mg/dL — ABNORMAL HIGH (ref 70–99)
Potassium: 4.5 mmol/L (ref 3.5–5.1)
Sodium: 131 mmol/L — ABNORMAL LOW (ref 135–145)

## 2021-12-11 LAB — CBC
HCT: 29.9 % — ABNORMAL LOW (ref 39.0–52.0)
Hemoglobin: 10.4 g/dL — ABNORMAL LOW (ref 13.0–17.0)
MCH: 33.1 pg (ref 26.0–34.0)
MCHC: 34.8 g/dL (ref 30.0–36.0)
MCV: 95.2 fL (ref 80.0–100.0)
Platelets: 254 10*3/uL (ref 150–400)
RBC: 3.14 MIL/uL — ABNORMAL LOW (ref 4.22–5.81)
RDW: 11 % — ABNORMAL LOW (ref 11.5–15.5)
WBC: 8 10*3/uL (ref 4.0–10.5)
nRBC: 0 % (ref 0.0–0.2)

## 2021-12-11 LAB — LEGIONELLA PNEUMOPHILA SEROGP 1 UR AG: L. pneumophila Serogp 1 Ur Ag: NEGATIVE

## 2021-12-11 LAB — HCV AB W REFLEX TO QUANT PCR: HCV Ab: NONREACTIVE

## 2021-12-11 MED ORDER — PREDNISONE 20 MG PO TABS
40.0000 mg | ORAL_TABLET | Freq: Every day | ORAL | Status: DC
  Administered 2021-12-11 – 2021-12-14 (×4): 40 mg via ORAL
  Filled 2021-12-11 (×4): qty 2

## 2021-12-11 NOTE — Progress Notes (Signed)
Occupational Therapy Treatment Patient Details Name: Charles Campos MRN: [id number] DOB: 25-Feb-1955 Today's Date: 12/11/2021   History of present illness Pt is a 66 yo male that presented to the ED for SOB, cough, congestion. PMH of tobacco use, HTN, COPD, etoh abuse.   OT comments  Pt seen for OT tx, pt in recliner, agreeable. Pt stood with supervision and VC for hand placement to improve technique and stability. Pt tolerated marching in place for ~18min and educated in perceived rate of exertion scale to help him identify optimal exertion and when to rest to avoid over-doing it. Limited marching resulted in a PRE of 5/10. Pt instructed in flutter valve and incentive spirometer with return demo requiring additional VC for technique. Pt continues to benefit from skilled OT services. Continue to recommend HHOT.    Recommendations for follow up therapy are one component of a multi-disciplinary discharge planning process, led by the attending physician.  Recommendations may be updated based on patient status, additional functional criteria and insurance authorization.    Follow Up Recommendations  Home health OT    Assistance Recommended at Discharge Intermittent Supervision/Assistance  Patient can return home with the following  Assistance with cooking/housework;Assist for transportation;Help with stairs or ramp for entrance   Equipment Recommendations  None recommended by OT    Recommendations for Other Services      Precautions / Restrictions Precautions Precautions: Fall Restrictions Weight Bearing Restrictions: No       Mobility Bed Mobility               General bed mobility comments: up in chair at start of session    Transfers Overall transfer level: Needs assistance Equipment used: Rolling walker (2 wheels) Transfers: Sit to/from Stand Sit to Stand: Supervision           General transfer comment: VC for hand placement to improve technique     Balance  Overall balance assessment: Needs assistance Sitting-balance support: Feet supported Sitting balance-Leahy Scale: Good     Standing balance support: Bilateral upper extremity supported Standing balance-Leahy Scale: Fair                             ADL either performed or assessed with clinical judgement   ADL                                              Extremity/Trunk Assessment              Vision       Perception     Praxis      Cognition Arousal/Alertness: Awake/alert Behavior During Therapy: WFL for tasks assessed/performed Overall Cognitive Status: Within Functional Limits for tasks assessed                                          Exercises Other Exercises Other Exercises: Pt marched in place and educated in perceived rate of exertion scale to help him identify optimal exertion and when to rest to avoid over-doing it. Limited marching resulted in a PRE of 5/10 Other Exercises: Pt instructed in flutter valve and incentive spirometer with return demo requiring additional VC for technique    Shoulder Instructions  General Comments      Pertinent Vitals/ Pain       Pain Assessment Pain Assessment: No/denies pain  Home Living                                          Prior Functioning/Environment              Frequency  Min 2X/week        Progress Toward Goals  OT Goals(current goals can now be found in the care plan section)  Progress towards OT goals: Progressing toward goals  Acute Rehab OT Goals Patient Stated Goal: go home OT Goal Formulation: With patient Time For Goal Achievement: 12/23/21 Potential to Achieve Goals: Good  Plan Discharge plan remains appropriate;Frequency remains appropriate    Co-evaluation                 AM-PAC OT "6 Clicks" Daily Activity     Outcome Measure   Help from another person eating meals?: None Help from another  person taking care of personal grooming?: A Little Help from another person toileting, which includes using toliet, bedpan, or urinal?: A Little Help from another person bathing (including washing, rinsing, drying)?: A Little Help from another person to put on and taking off regular upper body clothing?: None Help from another person to put on and taking off regular lower body clothing?: A Little 6 Click Score: 20    End of Session Equipment Utilized During Treatment: Rolling walker (2 wheels);Oxygen  OT Visit Diagnosis: Unsteadiness on feet (R26.81);History of falling (Z91.81);Muscle weakness (generalized) (M62.81)   Activity Tolerance Patient tolerated treatment well   Patient Left in chair;with call bell/phone within reach;Other (comment) (PT)   Nurse Communication          Time: 0093-8182 OT Time Calculation (min): 11 min  Charges: OT General Charges $OT Visit: 1 Visit OT Treatments $Self Care/Home Management : 8-22 mins  Ardeth Perfect., MPH, MS, OTR/L ascom [phone] 12/11/21, 1:32 PM

## 2021-12-11 NOTE — Progress Notes (Signed)
  Progress Note   Patient: Charles Campos:[id number] DOB: 03/22/1955 DOA: 12/07/2021     4 DOS: the patient was seen and examined on 12/11/2021   Brief hospital course: Mr. Charles Campos is a 66 year old male with history of tobacco use, hypertension, COPD, alcohol abuse, who presents emergency department for chief concerns of shortness of breath, cough, congestion. Upon arrival in the hospital, she had a significant hypoxemia, was placed on 4 L oxygen. Sodium level   COVID/influenza A/influenza B PCR were negative.  High sensitive troponin was 11 and on repeat was 117.  Patient is placed on IV steroids and antibiotics for COPD exacerbation.  Assessment and Plan: Acute hypoxemic respiratory failure. COPD exacerbation. Patient had an episode of worsening short of breath and bronchospasm yesterday, given additional dose of IV steroids.  Condition seem to be more stable now, still on oxygen with good saturation.  Continue oral steroids, patient is also on Rocephin.   Dysphagia secondary to esophageal dysmotility. Patient is seen by speech therapy, barium esophagram showed significant dysmotility in the esophagus.  No aspiration pneumonia.   Hyponatremia Chronic alcohol abuse Sodium continues to improve with salt tablets.   Moderate protein calorie malnutrition. Weight loss. Patient has BMI of 18.31, he was losing weight, he appears to be malnourished.  Continue Ensure.   Essential hypertension.  Continue to follow.   Pressure ulcer POA. Pressure Injury 12/09/21 Coccyx Mid Stage 1 -  Intact skin with non-blanchable redness of a localized area usually over a bony prominence. (Active)  12/09/21 1000  Location: Coccyx  Location Orientation: Mid  Staging: Stage 1 -  Intact skin with non-blanchable redness of a localized area usually over a bony prominence.  Wound Description (Comments):   Present on Admission: Yes        Subjective:  Patient still has significant short of  breath with exertion, cough, nonproductive.  Physical Exam: Vitals:   12/11/21 0328 12/11/21 0732 12/11/21 0737 12/11/21 1142  BP: 130/71 136/82  113/76  Pulse: (!) 108 (!) 105  (!) 107  Resp: 20 18  20   Temp: 98.3 F (36.8 C) 97.6 F (36.4 C)  (!) 97.3 F (36.3 C)  TempSrc: Oral Oral    SpO2: 97% 97% 97% 100%  Weight:      Height:       General exam: Appears calm and comfortable  Respiratory system: Decreased breathing sound without crackles or wheezes. Respiratory effort normal. Cardiovascular system: S1 & S2 heard, RRR. No JVD, murmurs, rubs, gallops or clicks. No pedal edema. Gastrointestinal system: Abdomen is nondistended, soft and nontender. No organomegaly or masses felt. Normal bowel sounds heard. Central nervous system: Alert and oriented. No focal neurological deficits. Extremities: Symmetric 5 x 5 power. Skin: No rashes, lesions or ulcers Psychiatry:  Mood & affect appropriate.   Data Reviewed:  Lab results reviewed.  Family Communication: Could not reach brother  Disposition: Status is: Inpatient Remains inpatient appropriate because: Severity of disease,  Planned Discharge Destination: Home    Time spent: 35 minutes  Author: Sharen Hones, MD 12/11/2021 12:12 PM  For on call review [email or web address].

## 2021-12-11 NOTE — Progress Notes (Signed)
Physical Therapy Treatment Patient Details Name: Charles Campos MRN: [id number] DOB: [birth year] Today's Date: 12/11/2021   History of Present Illness Pt is a 66 yo male that presented to the ED for SOB, cough, congestion. PMH of tobacco use, HTN, COPD, etoh abuse.    PT Comments    Patient alert, agreeable to PT, OT at bedside. Pt demonstrated some improvement in tolerance to activity this session. Supervision for transfers with RW (cued for hand placement). He ambulated ~59ft with RW, no LOB but very slow. SpO2 monitored, noted for difficulty obtaining accurate read. Pt endorsed some light headedness, placed on 4L for remainder of ambulation. Returned to room and back on 3L with all needs in reach. The patient would benefit from further skilled PT intervention to continue to progress towards goals. Recommendation remains appropriate.        Recommendations for follow up therapy are one component of a multi-disciplinary discharge planning process, led by the attending physician.  Recommendations may be updated based on patient status, additional functional criteria and insurance authorization.  Follow Up Recommendations  Home health PT     Assistance Recommended at Discharge Intermittent Supervision/Assistance  Patient can return home with the following Assistance with cooking/housework;Assist for transportation;Help with stairs or ramp for entrance   Equipment Recommendations  Rolling walker (2 wheels)    Recommendations for Other Services       Precautions / Restrictions Precautions Precautions: Fall Restrictions Weight Bearing Restrictions: No     Mobility  Bed Mobility               General bed mobility comments: up in chair at start of session    Transfers Overall transfer level: Needs assistance Equipment used: Rolling walker (2 wheels) Transfers: Sit to/from Stand Sit to Stand: Supervision           General transfer comment: cued for hand  placement    Ambulation/Gait Ambulation/Gait assistance: Min guard Gait Distance (Feet): 65 Feet Assistive device: Rolling walker (2 wheels)         General Gait Details: very slow, decreased cadence but no LOB   Stairs             Wheelchair Mobility    Modified Rankin (Stroke Patients Only)       Balance Overall balance assessment: Needs assistance Sitting-balance support: Feet supported Sitting balance-Leahy Scale: Good     Standing balance support: Bilateral upper extremity supported Standing balance-Leahy Scale: Fair                              Cognition Arousal/Alertness: Awake/alert Behavior During Therapy: WFL for tasks assessed/performed Overall Cognitive Status: Within Functional Limits for tasks assessed                                          Exercises      General Comments        Pertinent Vitals/Pain Pain Assessment Pain Assessment: No/denies pain    Home Living                          Prior Function            PT Goals (current goals can now be found in the care plan section) Progress towards PT goals: Progressing toward goals  Frequency    Min 2X/week      PT Plan Current plan remains appropriate    Co-evaluation              AM-PAC PT "6 Clicks" Mobility   Outcome Measure  Help needed turning from your back to your side while in a flat bed without using bedrails?: None Help needed moving from lying on your back to sitting on the side of a flat bed without using bedrails?: None Help needed moving to and from a bed to a chair (including a wheelchair)?: None Help needed standing up from a chair using your arms (e.g., wheelchair or bedside chair)?: A Little Help needed to walk in hospital room?: A Little Help needed climbing 3-5 steps with a railing? : A Little 6 Click Score: 21    End of Session Equipment Utilized During Treatment: Oxygen;Gait belt  (3-4L) Activity Tolerance: Patient tolerated treatment well Patient left: with call bell/phone within reach;in chair Nurse Communication: Mobility status PT Visit Diagnosis: Other abnormalities of gait and mobility (R26.89)     Time: 1128-1140 PT Time Calculation (min) (ACUTE ONLY): 12 min  Charges:  $Therapeutic Activity: 8-22 mins                     Olga Coaster PT, DPT 1:24 PM,12/11/21

## 2021-12-12 LAB — BASIC METABOLIC PANEL
Anion gap: 5 (ref 5–15)
BUN: 17 mg/dL (ref 8–23)
CO2: 37 mmol/L — ABNORMAL HIGH (ref 22–32)
Calcium: 8.4 mg/dL — ABNORMAL LOW (ref 8.9–10.3)
Chloride: 91 mmol/L — ABNORMAL LOW (ref 98–111)
Creatinine, Ser: 0.51 mg/dL — ABNORMAL LOW (ref 0.61–1.24)
GFR, Estimated: >60 mL/min (ref >60)
Glucose, Bld: 106 mg/dL — ABNORMAL HIGH (ref 70–99)
Potassium: 4.4 mmol/L (ref 3.5–5.1)
Sodium: 133 mmol/L — ABNORMAL LOW (ref 135–145)

## 2021-12-12 LAB — CBC
HCT: 28.8 % — ABNORMAL LOW (ref 39.0–52.0)
Hemoglobin: 10.1 g/dL — ABNORMAL LOW (ref 13.0–17.0)
MCH: 33.4 pg (ref 26.0–34.0)
MCHC: 35.1 g/dL (ref 30.0–36.0)
MCV: 95.4 fL (ref 80.0–100.0)
Platelets: 281 10*3/uL (ref 150–400)
RBC: 3.02 MIL/uL — ABNORMAL LOW (ref 4.22–5.81)
RDW: 10.9 % — ABNORMAL LOW (ref 11.5–15.5)
WBC: 10.7 10*3/uL — ABNORMAL HIGH (ref 4.0–10.5)
nRBC: 0 % (ref 0.0–0.2)

## 2021-12-12 MED ORDER — ALBUTEROL SULFATE (2.5 MG/3ML) 0.083% IN NEBU: 2.5000 mg | INHALATION_SOLUTION | RESPIRATORY_TRACT | Status: DC | PRN

## 2021-12-12 MED ORDER — IPRATROPIUM-ALBUTEROL 0.5-2.5 (3) MG/3ML IN SOLN
3.0000 mL | Freq: Three times a day (TID) | RESPIRATORY_TRACT | Status: DC
  Administered 2021-12-12 – 2021-12-13 (×3): 3 mL via RESPIRATORY_TRACT
  Filled 2021-12-12 (×3): qty 3

## 2021-12-12 NOTE — Progress Notes (Signed)
Speech Language Pathology Treatment: Dysphagia  Patient Details Name: Charles Campos MRN: [id number] DOB: [birth year] Today's Date: 12/12/2021 Time: 0315-9458 SLP Time Calculation (min) (ACUTE ONLY): 12 min  Assessment / Plan / Recommendation Clinical Impression  Pt seen for ongoing dysphagia management. Skilled observation was provided of pt consuming his regular breakfast with thin liquids via cup (coffee) and straw (juice). Pt states that he has been eating good. Pt continues to present with adequate oropharyngeal abilities when consuming regular diet textures and thin liquids. At this time, pt's risk of aspiration is likely low. Skilled ST is no longer indicated.    HPI HPI: Mr. Charles Campos is a 66 year old male with history of tobacco use, hypertension, COPD, alcohol abuse, who presents emergency department for chief concerns of shortness of breath, cough, congestion. Pt currently on a regular solids and thin liquids diet with 2L nasal canula. CXR 12/07/21: No evidence of acute cardiopulmonary disease. CT Angio Chest: Numerous small irregular pulmonary nodules, some with cavitation seen primarily in the upper lungs, with centrilobular and tree in bud configuration. 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 Some irregular nodules have a tree-in-bud configuration suggesting a superimposed airways-centered infection. Bronchial wall thickening which could be related to acute bronchitis or COPD. Recommend follow-up chest CT in 3 months. No evidence of pulmonary embolism. Prominent right hilar lymph node, likely reactive.      SLP Plan  All goals met      Recommendations for follow up therapy are one component of a multi-disciplinary discharge planning process, led by the attending physician.  Recommendations may be updated based on patient status, additional functional criteria and insurance authorization.     Recommendations  Diet recommendations: Regular;Thin liquid Liquids provided via: Cup;Straw Medication Administration: Whole meds with liquid Supervision: Patient able to self feed;Intermittent supervision to cue for compensatory strategies Compensations: Minimize environmental distractions;Slow rate;Small sips/bites Postural Changes and/or Swallow Maneuvers: Seated upright 90 degrees                Oral Care Recommendations: Oral care BID Follow Up Recommendations: No SLP follow up Assistance recommended at discharge: None SLP Visit Diagnosis: Dysphagia, unspecified (R13.10) Plan: All goals met         Charles Campos, M.S., CCC-SLP, Mining engineer Certified Brain Injury [place]  [place] Office [phone] Ascom [phone] Fax [phone]

## 2021-12-12 NOTE — Progress Notes (Signed)
  Progress Note   Patient: Charles Campos TKZ:[id number] DOB: 11-Oct-1955 DOA: 12/07/2021     5 DOS: the patient was seen and examined on 12/12/2021   Brief hospital course: Mr. Charles Campos is a 66 year old male with history of tobacco use, hypertension, COPD, alcohol abuse, who presents emergency department for chief concerns of shortness of breath, cough, congestion. Upon arrival in the hospital, she had a significant hypoxemia, was placed on 4 L oxygen. Sodium level   COVID/influenza A/influenza B PCR were negative.  High sensitive troponin was 11 and on repeat was 117.  Patient is placed on IV steroids and antibiotics for COPD exacerbation.  Assessment and Plan:  Acute hypoxemic respiratory failure. COPD exacerbation. Patient is more stable today, still has same short of breath with exertion, still on 2 L oxygen.  But feel better.  Continue oral steroids and discontinue antibiotics, patient does not have pneumonia.   Dysphagia secondary to esophageal dysmotility. Patient is seen by speech therapy, barium esophagram showed significant dysmotility in the esophagus.  No aspiration pneumonia.   Hyponatremia Chronic alcohol abuse Sodium level much improved, continue to monitor.   Moderate protein calorie malnutrition. Weight loss. Patient has BMI of 18.31, he was losing weight, he appears to be malnourished.  Continue Ensure.   Essential hypertension.  Continue to follow.   Pressure ulcer POA. Pressure Injury 12/09/21 Coccyx Mid Stage 1 -  Intact skin with non-blanchable redness of a localized area usually over a bony prominence. (Active)  12/09/21 1000  Location: Coccyx  Location Orientation: Mid  Staging: Stage 1 -  Intact skin with non-blanchable redness of a localized area usually over a bony prominence.  Wound Description (Comments):   Present on Admission: Yes         Subjective:  Patient still has not short of breath with exertion, cough,  nonproductive. Overall feels better.  Physical Exam: Vitals:   12/12/21 0357 12/12/21 0809 12/12/21 0814 12/12/21 1120  BP: 119/72 130/71  136/77  Pulse: (!) 102 99 (!) 102 60  Resp: 18 17 18 18   Temp: (!) 97.5 F (36.4 C) 97.9 F (36.6 C)  97.7 F (36.5 C)  TempSrc: Oral     SpO2: 99% 97% 93% 94%  Weight:      Height:       General exam: Appears calm and comfortable  Respiratory system: Decreased breath sounds. Respiratory effort normal. Cardiovascular system: S1 & S2 heard, RRR. No JVD, murmurs, rubs, gallops or clicks. No pedal edema. Gastrointestinal system: Abdomen is nondistended, soft and nontender. No organomegaly or masses felt. Normal bowel sounds heard. Central nervous system: Alert and oriented. No focal neurological deficits. Extremities: Symmetric 5 x 5 power. Skin: No rashes, lesions or ulcers Psychiatry: Judgement and insight appear normal. Mood & affect appropriate.   Data Reviewed:  Lab results reviewed.  Family Communication:   Disposition: Status is: Inpatient Remains inpatient appropriate because: Severity of disease.  Planned Discharge Destination: Home    Time spent: 35 minutes  Author: Sharen Hones, MD 12/12/2021 1:01 PM  For on call review [email or web address].

## 2021-12-13 LAB — BASIC METABOLIC PANEL
Anion gap: 5 (ref 5–15)
BUN: 16 mg/dL (ref 8–23)
CO2: 38 mmol/L — ABNORMAL HIGH (ref 22–32)
Calcium: 8.2 mg/dL — ABNORMAL LOW (ref 8.9–10.3)
Chloride: 88 mmol/L — ABNORMAL LOW (ref 98–111)
Creatinine, Ser: 0.48 mg/dL — ABNORMAL LOW (ref 0.61–1.24)
GFR, Estimated: >60 mL/min (ref >60)
Glucose, Bld: 92 mg/dL (ref 70–99)
Potassium: 4.5 mmol/L (ref 3.5–5.1)
Sodium: 131 mmol/L — ABNORMAL LOW (ref 135–145)

## 2021-12-13 LAB — CBC
HCT: 28.5 % — ABNORMAL LOW (ref 39.0–52.0)
Hemoglobin: 9.9 g/dL — ABNORMAL LOW (ref 13.0–17.0)
MCH: 32.7 pg (ref 26.0–34.0)
MCHC: 34.7 g/dL (ref 30.0–36.0)
MCV: 94.1 fL (ref 80.0–100.0)
Platelets: 310 10*3/uL (ref 150–400)
RBC: 3.03 MIL/uL — ABNORMAL LOW (ref 4.22–5.81)
RDW: 10.9 % — ABNORMAL LOW (ref 11.5–15.5)
WBC: 9.4 10*3/uL (ref 4.0–10.5)
nRBC: 0 % (ref 0.0–0.2)

## 2021-12-13 LAB — PHOSPHORUS: Phosphorus: 3.6 mg/dL (ref 2.5–4.6)

## 2021-12-13 LAB — MAGNESIUM: Magnesium: 1.6 mg/dL — ABNORMAL LOW (ref 1.7–2.4)

## 2021-12-13 MED ORDER — INFLUENZA VAC SPLIT QUAD 0.5 ML IM SUSY
0.5000 mL | PREFILLED_SYRINGE | INTRAMUSCULAR | Status: AC
  Administered 2021-12-14: 0.5 mL via INTRAMUSCULAR
  Filled 2021-12-13: qty 0.5

## 2021-12-13 MED ORDER — MAGNESIUM SULFATE 2 GM/50ML IV SOLN
2.0000 g | Freq: Once | INTRAVENOUS | Status: AC
  Administered 2021-12-13: 2 g via INTRAVENOUS
  Filled 2021-12-13: qty 50

## 2021-12-13 NOTE — Progress Notes (Signed)
  Progress Note   Patient: Charles Campos [id number]:[id number] DOB: Aug 08, 1955 DOA: 12/07/2021     6 DOS: the patient was seen and examined on 12/13/2021   Brief hospital course: Mr. Millard Bautch is a 66 year old male with history of tobacco use, hypertension, COPD, alcohol abuse, who presents emergency department for chief concerns of shortness of breath, cough, congestion. Upon arrival in the hospital, she had a significant hypoxemia, was placed on 4 L oxygen. Sodium level   COVID/influenza A/influenza B PCR were negative.  High sensitive troponin was 11 and on repeat was 117.  Patient is placed on IV steroids and antibiotics for COPD exacerbation.  Assessment and Plan: Acute hypoxemic respiratory failure. COPD exacerbation. Patient condition continued to improve, still on 2 L oxygen with good saturation.  We will continue steroids for today.  Obtain home oxygen evaluation at the time of discharge tomorrow. Patient currently does not have any shortness, no resources to buy medicines.  We will keep patient 1 more day, so that he can get his medicine before discharge.    Dysphagia secondary to esophageal dysmotility. Patient is seen by speech therapy, barium esophagram showed significant dysmotility in the esophagus.  No aspiration pneumonia.   Hyponatremia Chronic alcohol abuse Hypomagnesemia. We will give 2 g of magnesium sulfate. No evidence of alcohol withdrawal, sodium level is getting better gradually.  Still on salt tablets.   Moderate protein calorie malnutrition. Weight loss. Patient has BMI of 18.31, he was losing weight, he appears to be malnourished.  Continue Ensure.   Essential hypertension.  Continue to follow.   Pressure ulcer POA. Pressure Injury 12/09/21 Coccyx Mid Stage 1 -  Intact skin with non-blanchable redness of a localized area usually over a bony prominence. (Active)  12/09/21 1000  Location: Coccyx  Location Orientation: Mid  Staging: Stage 1 -  Intact  skin with non-blanchable redness of a localized area usually over a bony prominence.  Wound Description (Comments):   Present on Admission: Yes        Subjective:  Patient feels much improved, he was able to walk to the bathroom, still has short of breath with exertion which is baseline.  Cough nonproductive.  Physical Exam: Vitals:   12/13/21 0051 12/13/21 0426 12/13/21 0739 12/13/21 0827  BP: 123/77 128/74 126/78   Pulse: 93 62 (!) 104   Resp: 18 19 18    Temp: 98 F (36.7 C) 97.7 F (36.5 C) 97.8 F (36.6 C)   TempSrc: Oral Oral    SpO2: 99% 97% 98% 97%  Weight:      Height:       General exam: Appears calm and comfortable  Respiratory system: Decreased breathing sounds with some wheezes. Respiratory effort normal. Cardiovascular system: S1 & S2 heard, RRR. No JVD, murmurs, rubs, gallops or clicks. No pedal edema. Gastrointestinal system: Abdomen is nondistended, soft and nontender. No organomegaly or masses felt. Normal bowel sounds heard. Central nervous system: Alert and oriented. No focal neurological deficits. Extremities: Symmetric 5 x 5 power. Skin: No rashes, lesions or ulcers Psychiatry: Judgement and insight appear normal. Mood & affect appropriate.   Data Reviewed:  Lab results reviewed.  Family Communication: None  Disposition: Status is: Inpatient Remains inpatient appropriate because: Severity of disease, unsafe discharge.  Planned Discharge Destination: Home    Time spent: 35 minutes  Author: Sharen Hones, MD 12/13/2021 10:38 AM  For on call review [email or web address].

## 2021-12-14 ENCOUNTER — Other Ambulatory Visit: Payer: Self-pay

## 2021-12-14 LAB — BASIC METABOLIC PANEL
Anion gap: 6 (ref 5–15)
BUN: 18 mg/dL (ref 8–23)
CO2: 37 mmol/L — ABNORMAL HIGH (ref 22–32)
Calcium: 8.3 mg/dL — ABNORMAL LOW (ref 8.9–10.3)
Chloride: 89 mmol/L — ABNORMAL LOW (ref 98–111)
Creatinine, Ser: 0.47 mg/dL — ABNORMAL LOW (ref 0.61–1.24)
GFR, Estimated: >60 mL/min (ref >60)
Glucose, Bld: 87 mg/dL (ref 70–99)
Potassium: 4.4 mmol/L (ref 3.5–5.1)
Sodium: 132 mmol/L — ABNORMAL LOW (ref 135–145)

## 2021-12-14 LAB — MAGNESIUM: Magnesium: 1.7 mg/dL (ref 1.7–2.4)

## 2021-12-14 MED ORDER — TIOTROPIUM BROMIDE MONOHYDRATE 18 MCG IN CAPS
18.0000 ug | ORAL_CAPSULE | Freq: Every day | RESPIRATORY_TRACT | 0 refills | Status: AC
  Filled 2021-12-14: qty 30, 30d supply, fill #0

## 2021-12-14 MED ORDER — ALBUTEROL SULFATE HFA 108 (90 BASE) MCG/ACT IN AERS: 2.0000 | INHALATION_SPRAY | RESPIRATORY_TRACT | 0 refills | Status: AC | PRN

## 2021-12-14 MED ORDER — FLUTICASONE-SALMETEROL 230-21 MCG/ACT IN AERO
2.0000 | INHALATION_SPRAY | Freq: Two times a day (BID) | RESPIRATORY_TRACT | 0 refills | Status: DC
  Filled 2021-12-14: qty 1, 1d supply, fill #0

## 2021-12-14 MED ORDER — FLUTICASONE FUROATE-VILANTEROL 200-25 MCG/ACT IN AEPB
1.0000 | INHALATION_SPRAY | Freq: Every day | RESPIRATORY_TRACT | 0 refills | Status: AC
  Filled 2021-12-14: qty 60, 60d supply, fill #0

## 2021-12-14 MED ORDER — MAGNESIUM SULFATE 2 GM/50ML IV SOLN
2.0000 g | Freq: Once | INTRAVENOUS | Status: AC
  Administered 2021-12-14: 2 g via INTRAVENOUS
  Filled 2021-12-14: qty 50

## 2021-12-14 MED ORDER — PREDNISONE 20 MG PO TABS: ORAL_TABLET | ORAL | 0 refills | Status: AC

## 2021-12-14 MED ORDER — SODIUM CHLORIDE 1 G PO TABS
1.0000 g | ORAL_TABLET | Freq: Two times a day (BID) | ORAL | 0 refills | Status: AC
  Filled 2021-12-14: qty 14, 7d supply, fill #0

## 2021-12-14 NOTE — Progress Notes (Signed)
Patient on room air at rest with o2 sat of 92%. Patient while ambulating on room air with O2 sat of 76%. Patient while ambulating with oxygen at 2 LPM continuously with O2 sat of 93%

## 2021-12-14 NOTE — Progress Notes (Signed)
RW and oxygen requested via [employer].

## 2021-12-14 NOTE — Progress Notes (Signed)
Physical Therapy Treatment Patient Details Name: Charles Campos MRN: [id number] DOB: 1956-02-14 Today's Date: 12/14/2021   History of Present Illness Pt is a 66 yo male that presented to the ED for SOB, cough, congestion. PMH of tobacco use, HTN, COPD, etoh abuse.    PT Comments    Pt received sitting on EOB with O2 via [place] agreeable to to participate in therapy. PT provided 5 step stair training with RHR with SUP to CGA of 1. Pt ambulated in hallways with CGA ot sup with RW. Pt SPO2 between 84 to 91 through out the session with O2 at 2L via . PT educated pt regarding PLB, normal O2 levels and proper use of pulse oximeter. Pt has pulse oximeter at home. Pt also advised to use spirometer every 3 hours 10 reps to improve aerobic capacity. Pt able to recall all instructions well. Pt will benefit form HHPT after acute to improve endurance, strength and return to PLOF with O2 use.   Recommendations for follow up therapy are one component of a multi-disciplinary discharge planning process, led by the attending physician.  Recommendations may be updated based on patient status, additional functional criteria and insurance authorization.  Follow Up Recommendations  Home health PT     Assistance Recommended at Discharge Intermittent Supervision/Assistance  Patient can return home with the following Assistance with cooking/housework;Assist for transportation;Help with stairs or ramp for entrance   Equipment Recommendations  Rolling walker (2 wheels)    Recommendations for Other Services       Precautions / Restrictions Restrictions Weight Bearing Restrictions: No     Mobility  Bed Mobility Overal bed mobility: Modified Independent             General bed mobility comments: received sitting on EOB    Transfers Overall transfer level: Needs assistance Equipment used: Rolling walker (2 wheels) Transfers: Sit to/from Stand Sit to Stand: Supervision                 Ambulation/Gait Ambulation/Gait assistance: Supervision Gait Distance (Feet): 40 Feet Assistive device: Rolling walker (2 wheels) Gait Pattern/deviations: Decreased stride length Gait velocity: dec     General Gait Details: very slow, decreased cadence but no LOB   Stairs 5 steps with CGA to SUP using RHR.              Wheelchair Mobility    Modified Rankin (Stroke Patients Only)       Balance Overall balance assessment: Needs assistance Sitting-balance support: Feet supported Sitting balance-Leahy Scale: Normal     Standing balance support: Bilateral upper extremity supported Standing balance-Leahy Scale: Fair Standing balance comment: needs assistance due to weakness and SPO2 desaturation                            Cognition Arousal/Alertness: Awake/alert Behavior During Therapy: WFL for tasks assessed/performed Overall Cognitive Status: Within Functional Limits for tasks assessed                                 General Comments: A&Ox3 (unable to state place/name of hospital) and recall instructions        Exercises      General Comments        Pertinent Vitals/Pain Pain Assessment Pain Assessment: No/denies pain    Home Living   Living Arrangements: Other relatives  Prior Function            PT Goals (current goals can now be found in the care plan section) Acute Rehab PT Goals Patient Stated Goal: to feel better Progress towards PT goals: Progressing toward goals    Frequency    Min 2X/week      PT Plan Current plan remains appropriate    Co-evaluation              AM-PAC PT "6 Clicks" Mobility   Outcome Measure  Help needed turning from your back to your side while in a flat bed without using bedrails?: None Help needed moving from lying on your back to sitting on the side of a flat bed without using bedrails?: None Help needed moving to and from a bed to a  chair (including a wheelchair)?: None Help needed standing up from a chair using your arms (e.g., wheelchair or bedside chair)?: None Help needed to walk in hospital room?: A Little Help needed climbing 3-5 steps with a railing? : A Little 6 Click Score: 22    End of Session Equipment Utilized During Treatment: Oxygen;Gait belt Activity Tolerance: Patient tolerated treatment well;Patient limited by fatigue Patient left: in bed;with call bell/phone within reach Nurse Communication: Mobility status (D/C planning) PT Visit Diagnosis: Other abnormalities of gait and mobility (R26.89)     Time: 1050-1101 PT Time Calculation (min) (ACUTE ONLY): 11 min  Charges:  $Therapeutic Activity: 8-22 mins                     Trezure Cronk PT DPT 11:17 AM,12/14/21

## 2021-12-14 NOTE — TOC Transition Note (Signed)
Transition of Care [facility]) - CM/SW Discharge Note   Patient Details  Name: Charles Campos MRN: [id number] Date of Birth: [birth year]  Transition of Care [facility]) CM/SW Contact:  Laurena Slimmer, RN Phone Number: 12/14/2021, 3:04 PM   Clinical Narrative:    Patient discharging home. Family transporting. RW and  oxygen requested. TOC signing off.      Barriers to Discharge: Continued Medical Work up   Patient Goals and CMS Choice Patient states their goals for this hospitalization and ongoing recovery are:: to go home CMS Medicare.gov Compare Post Acute Care list provided to:: Patient Choice offered to / list presented to : Patient  Discharge Placement                       Discharge Plan and Services                                     Social Determinants of Health (SDOH) Interventions     Readmission Risk Interventions     No data to display

## 2021-12-14 NOTE — Discharge Summary (Addendum)
Physician Discharge Summary   Patient: Charles Campos MRN: [id number] DOB: Jan 31, 1956  Admit date:     12/07/2021  Discharge date: 12/14/21  Discharge Physician: Sharen Hones   PCP: Patient, No Pcp Per   Recommendations at discharge:   Follow-up with PCP in 1 week.  Discharge Diagnoses: Principal Problem:   COPD exacerbation ([place]) Active Problems:   Hyponatremia   Acute hypoxemic respiratory failure (HCC)   Alcohol abuse   Tobacco use   Weight loss   Leukocytosis   Pressure injury of skin  Resolved Problems:   * No resolved hospital problems. [facility] Course: Mr. Charles Campos is a 66 year old male with history of tobacco use, hypertension, COPD, alcohol abuse, who presents emergency department for chief concerns of shortness of breath, cough, congestion. Upon arrival in the hospital, she had a significant hypoxemia, was placed on 4 L oxygen. Sodium level   COVID/influenza A/influenza B PCR were negative.  High sensitive troponin was 11 and on repeat was 117.  Patient is placed on IV steroids and antibiotics for COPD exacerbation.  Assessment and Plan: Acute hypoxemic respiratory failure. COPD exacerbation. Condition has improved, currently on 1 L oxygen with good saturation.  Will obtain home oxygen evaluation. Patient completed 4 days of Rocephin, continue steroid taper.  Also prescribed as needed albuterol, scheduled Advair and Spiriva.  Prescription sent to [place]. Patient currently does not have a PCP, will be [facility].  Also [facility] to [facility].   Dysphagia secondary to esophageal dysmotility. Patient is seen by speech therapy, barium esophagram showed significant dysmotility in the esophagus.  No aspiration pneumonia.   Hyponatremia Chronic alcohol abuse Hypomagnesemia. Patient did not develop any alcohol withdrawal. Sodium level is getting better, I will continue salt tablets 1 g twice a day for 7 days, follow-[facility] with  [facility]. Magnesium level 1.7 after 2 g of mag yesterday, will give 2 g of magnesium sulfate again today. Alcohol cessation encouraged.   Moderate protein calorie malnutrition. Weight loss. Patient has BMI of 18.31, he was losing weight, he appears to be malnourished.  Follow-up with PCP as outpatient.   Essential hypertension.  Initially elevated blood pressure secondary to respite distress.  Blood pressures are not elevated anymore.  Discontinue Norvasc.   Pressure ulcer POA. Pressure Injury 12/09/21 Coccyx Mid Stage 1 -  Intact skin with non-blanchable redness of a localized area usually over a bony prominence. (Active)  12/09/21 1000  Location: Coccyx  Location Orientation: Mid  Staging: Stage 1 -  Intact skin with non-blanchable redness of a localized area usually over a bony prominence.  Wound Description (Comments):   Present on Admission: Yes    Avoid pressure.       Consultants: None Procedures performed: None  Disposition: Home Diet recommendation:  Discharge Diet Orders (From admission, onward)     Start     Ordered   12/14/21 0000  Diet - low sodium heart healthy        12/14/21 0902           Cardiac diet DISCHARGE MEDICATION: Allergies as of 12/14/2021   No Known Allergies      Medication List     STOP taking these medications    azithromycin 250 MG tablet Commonly known as: Zithromax Z-Pak   guaiFENesin 100 MG/5ML Soln Commonly known as: ROBITUSSIN   naproxen 500 MG tablet Commonly known as: Naprosyn       TAKE these medications    albuterol  108 (90 Base) MCG/ACT inhaler Commonly known as: Proventil HFA Inhale 2 puffs into the lungs every 4 (four) hours as needed for wheezing or shortness of breath.   fluticasone-salmeterol 230-21 MCG/ACT inhaler Commonly known as: Advair HFA Inhale 2 puffs into the lungs 2 (two) times daily.   predniSONE 20 MG tablet Commonly known as: Deltasone Take 2 tablets (40 mg total) by mouth  daily for 3 days, THEN 1 tablet (20 mg total) daily for 3 days, THEN 0.5 tablets (10 mg total) daily for 3 days. Start taking on: December 14, 2021 What changed: See the new instructions.   sodium chloride 1 g tablet Take 1 tablet (1 g total) by mouth 2 (two) times daily with a meal for 7 days.   tiotropium 18 MCG inhalation capsule Commonly known as: Spiriva HandiHaler Place 1 capsule (18 mcg total) into inhaler and inhale daily.               Discharge Care Instructions  (From admission, onward)           Start     Ordered   12/14/21 0000  Discharge wound care:       Comments: Avoid pressure   12/14/21 0902            Follow-up Information     [facility] Follow up in 1 week(s).   Specialty: Primary Care Contact information: [address] [place] [phone]               Discharge Exam: Danley Danker Weights   12/07/21 1518 12/09/21 1018  Weight: 52.2 kg 49.9 kg   General exam: Appears calm and comfortable  Respiratory system: Decreased breath sounds. Respiratory effort normal. Cardiovascular system: S1 & S2 heard, RRR. No JVD, murmurs, rubs, gallops or clicks. No pedal edema. Gastrointestinal system: Abdomen is nondistended, soft and nontender. No organomegaly or masses felt. Normal bowel sounds heard. Central nervous system: Alert and oriented. No focal neurological deficits. Extremities: Symmetric 5 x 5 power. Skin: No rashes, lesions or ulcers Psychiatry: Judgement and insight appear normal. Mood & affect appropriate.    Condition at discharge: fair  The results of significant diagnostics from this hospitalization (including imaging, microbiology, ancillary and laboratory) are listed below for reference.   Imaging Studies: DG ESOPHAGUS W SINGLE CM (SOL OR THIN BA)  Result Date: 12/10/2021 CLINICAL DATA:  Patient presents with dysphagia, coughing. EXAM: ESOPHAGUS/BARIUM SWALLOW TECHNIQUE:  Single contrast examination was performed using thin liquid barium. This exam was performed by Reatha Armour, PA-C , and was supervised and interpreted by Davina Poke, DO. Exam complicated by patient lethargy, weakness, and inability to drink contrast material through straw. FLUOROSCOPY: Radiation Exposure Index (as provided by the fluoroscopic device): 8.60 mGy Kerma COMPARISON:  None Available. FINDINGS: Swallowing: Appears normal. No vestibular penetration or aspiration seen. Pharynx: Unremarkable. Esophagus: Normal appearance. Esophageal motility: Mild dysmotility with occasional tertiary contraction. Hiatal Hernia: None visualized. Gastroesophageal reflux: Mild reflux visualized with patient coughing. Ingested 32mm barium tablet: Not given. Other: None. IMPRESSION: 1. Limited exam. 2. Mild dysmotility. 3. Mild gastroesophageal reflux. Electronically Signed   By: Davina Poke D.O.   On: 12/10/2021 16:35   CT Angio Chest Pulmonary Embolism (PE) W or WO Contrast  Result Date: 12/08/2021 CLINICAL DATA:  dyspnea, elevated dimer EXAM: CT ANGIOGRAPHY CHEST WITH CONTRAST TECHNIQUE: Multidetector CT imaging of the chest was performed using the standard protocol during bolus administration of intravenous contrast. Multiplanar CT image  reconstructions and MIPs were obtained to evaluate the vascular anatomy. RADIATION DOSE REDUCTION: This exam was performed according to the departmental dose-optimization program which includes automated exposure control, adjustment of the mA and/or kV according to patient size and/or use of iterative reconstruction technique. CONTRAST:  50mL OMNIPAQUE IOHEXOL 350 MG/ML SOLN COMPARISON:  None Available. FINDINGS: Cardiovascular: Satisfactory opacification of the pulmonary arteries to the segmental level. No evidence of pulmonary embolism. Normal cardiac size.No pericardial disease. Coronary artery atherosclerosis. Mild atherosclerosis of the thoracic aorta. Mediastinum/Nodes:  Prominent right hilar lymph node, likely reactive. The thyroid is unremarkable. Esophagus is unremarkable. Lungs/Pleura: Numerous small irregular pulmonary nodules, some with cavitation seen primarily upper lungs, in a centrilobular and tree in bud configuration. Diffuse mild bronchial wall thickening. Areas of mucous plugging in the lower lobes. Focal paraseptal emphysema in the medial right upper lobe. No pleural effusion. No pneumothorax. Upper Abdomen: No acute abnormality. Musculoskeletal: No chest wall abnormality. No acute osseous abnormality. No suspicious osseous lesion. Review of the MIP images confirms the above findings. IMPRESSION: Numerous small irregular pulmonary nodules, some with cavitation seen primarily in the upper lungs, with centrilobular and tree in bud configuration. 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 Some irregular nodules have a tree-in-bud configuration suggesting a superimposed airways-centered infection. Bronchial wall thickening which could be related to acute bronchitis or COPD. Recommend follow-up chest CT in 3 months. No evidence of pulmonary embolism. Prominent right hilar lymph node, likely reactive. Electronically Signed   By: Maurine Simmering M.D.   On: 12/08/2021 10:09   DG Chest 2 View  Result Date: 12/07/2021 CLINICAL DATA:  Shortness of breath EXAM: CHEST - 2 VIEW COMPARISON:  Radiograph 07/08/2015 FINDINGS: The heart size and mediastinal contours are within normal limits.No focal airspace disease. No pleural effusion or pneumothorax.Thoracic spondylosis. No acute osseous abnormality. IMPRESSION: No evidence of acute cardiopulmonary disease. Electronically Signed   By: Maurine Simmering M.D.   On: 12/07/2021 16:07    Microbiology: Results for orders placed or performed during the hospital encounter of 12/07/21  Resp Panel by RT-PCR (Flu A&B, Covid) Anterior Nasal Swab     Status: None    Collection Time: 12/07/21  3:30 PM   Specimen: Anterior Nasal Swab  Result Value Ref Range Status   SARS Coronavirus 2 by RT PCR NEGATIVE NEGATIVE Final    Comment: (NOTE) SARS-CoV-2 target nucleic acids are NOT DETECTED.  The SARS-CoV-2 RNA is generally detectable in upper respiratory specimens during the acute phase of infection. The lowest concentration of SARS-CoV-2 viral copies this assay can detect is 138 copies/mL. A negative result does not preclude SARS-Cov-2 infection and should not be used as the sole basis for treatment or other patient management decisions. A negative result may occur with  improper specimen collection/handling, submission of specimen other than nasopharyngeal swab, presence of viral mutation(s) within the areas targeted by this assay, and inadequate number of viral copies(<138 copies/mL). A negative result must be combined with clinical observations, patient history, and epidemiological information. The expected result is Negative.  Fact Sheet for Patients:  EntrepreneurPulse.com.au  Fact Sheet for Healthcare Providers:  IncredibleEmployment.be  This test is no t yet approved or cleared by the [place] FDA and  has been authorized for detection and/or diagnosis of SARS-CoV-2 by FDA under an Emergency Use Authorization (EUA). This EUA will remain  in effect (meaning this test can be used) for the duration of the COVID-19 declaration under  Section 564(b)(1) of the Act, 21 U.S.C.section 360bbb-3(b)(1), unless the authorization is terminated  or revoked sooner.       Influenza A by PCR NEGATIVE NEGATIVE Final   Influenza B by PCR NEGATIVE NEGATIVE Final    Comment: (NOTE) The Xpert Xpress SARS-CoV-2/FLU/RSV plus assay is intended as an aid in the diagnosis of influenza from Nasopharyngeal swab specimens and should not be used as a sole basis for treatment. Nasal washings and aspirates are unacceptable for  Xpert Xpress SARS-CoV-2/FLU/RSV testing.  Fact Sheet for Patients: EntrepreneurPulse.com.au  Fact Sheet for Healthcare Providers: IncredibleEmployment.be  This test is not yet approved or cleared by the [place] FDA and has been authorized for detection and/or diagnosis of SARS-CoV-2 by FDA under an Emergency Use Authorization (EUA). This EUA will remain in effect (meaning this test can be used) for the duration of the COVID-19 declaration under Section 564(b)(1) of the Act, 21 U.S.C. section 360bbb-3(b)(1), unless the authorization is terminated or revoked.  Performed at [facility], [address] [place], [place] [address]     Labs: CBC: Recent Labs  Lab 12/07/21 1523 12/08/21 0701 12/09/21 0456 12/10/21 0336 12/11/21 0423 12/12/21 0417 12/13/21 0413  WBC 12.3*   < > 21.3* 20.3* 8.0 10.7* 9.4  NEUTROABS 9.7*  --   --   --   --   --   --   HGB 16.2   < > 13.8 11.2* 10.4* 10.1* 9.9*  HCT 44.8   < > 38.7* 32.7* 29.9* 28.8* 28.5*  MCV 89.4   < > 93.9 95.1 95.2 95.4 94.1  PLT 194   < > 252 270 254 281 310   < > = values in this interval not displayed.   Basic Metabolic Panel: Recent Labs  Lab 12/07/21 2330 12/08/21 0701 12/10/21 0336 12/11/21 0423 12/12/21 0417 12/13/21 0413 12/14/21 0629  NA  --    < > 128* 131* 133* 131* 132*  K  --    < > 5.0 4.5 4.4 4.5 4.4  CL  --    < > 91* 88* 91* 88* 89*  CO2  --    < > 34* 37* 37* 38* 37*  GLUCOSE  --    < > 139* 168* 106* 92 87  BUN  --    < > 22 20 17 16 18   CREATININE  --    < > 0.50* 0.43* 0.51* 0.48* 0.47*  CALCIUM  --    < > 8.4* 8.3* 8.4* 8.2* 8.3*  MG 1.7  --   --   --   --  1.6* 1.7  PHOS 3.6  --   --   --   --  3.6  --    < > = values in this interval not displayed.   Liver Function Tests: Recent Labs  Lab 12/07/21 1523  AST 28  ALT 26  ALKPHOS 47  BILITOT 1.4*  PROT 6.9  ALBUMIN 3.4*   CBG: No results for input(s): "GLUCAP" in the last 168  hours.  Discharge time spent: greater than 30 minutes.  Signed: Sharen Hones, MD [facility] 12/14/2021

## 2021-12-15 ENCOUNTER — Other Ambulatory Visit: Payer: Self-pay

## 2021-12-25 ENCOUNTER — Other Ambulatory Visit: Payer: Self-pay

## 2022-06-12 ENCOUNTER — Other Ambulatory Visit: Payer: Self-pay

## 2022-06-15 ENCOUNTER — Other Ambulatory Visit: Payer: Self-pay

## 2022-07-14 ENCOUNTER — Encounter: Payer: Self-pay | Admitting: Emergency Medicine

## 2022-07-14 ENCOUNTER — Ambulatory Visit
Admission: EM | Admit: 2022-07-14 | Discharge: 2022-07-14 | Disposition: A | Discharge status: Home / Self Care | Payer: Medicare HMO | Attending: Family Medicine | Admitting: Family Medicine

## 2022-07-14 DIAGNOSIS — I160 Hypertensive urgency: Secondary | ICD-10-CM | POA: Insufficient documentation

## 2022-07-14 DIAGNOSIS — I10 Essential (primary) hypertension: Secondary | ICD-10-CM | POA: Diagnosis not present

## 2022-07-14 LAB — CBC WITH DIFFERENTIAL/PLATELET
Abs Immature Granulocytes: 0.02 10*3/uL (ref 0.00–0.07)
Basophils Absolute: 0 10*3/uL (ref 0.0–0.1)
Basophils Relative: 0 %
Eosinophils Absolute: 0 10*3/uL (ref 0.0–0.5)
Eosinophils Relative: 1 %
HCT: 41.3 % (ref 39.0–52.0)
Hemoglobin: 14.6 g/dL (ref 13.0–17.0)
Immature Granulocytes: 0 %
Lymphocytes Relative: 13 %
Lymphs Abs: 0.7 10*3/uL (ref 0.7–4.0)
MCH: 33 pg (ref 26.0–34.0)
MCHC: 35.4 g/dL (ref 30.0–36.0)
MCV: 93.4 fL (ref 80.0–100.0)
Monocytes Absolute: 0.5 10*3/uL (ref 0.1–1.0)
Monocytes Relative: 10 %
Neutro Abs: 4 10*3/uL (ref 1.7–7.7)
Neutrophils Relative %: 76 %
Platelets: 208 10*3/uL (ref 150–400)
RBC: 4.42 MIL/uL (ref 4.22–5.81)
RDW: 13 % (ref 11.5–15.5)
WBC: 5.3 10*3/uL (ref 4.0–10.5)
nRBC: 0 % (ref 0.0–0.2)

## 2022-07-14 LAB — BASIC METABOLIC PANEL
Anion gap: 11 (ref 5–15)
BUN: 8 mg/dL (ref 8–23)
CO2: 25 mmol/L (ref 22–32)
Calcium: 8.9 mg/dL (ref 8.9–10.3)
Chloride: 97 mmol/L — ABNORMAL LOW (ref 98–111)
Creatinine, Ser: 0.58 mg/dL — ABNORMAL LOW (ref 0.61–1.24)
GFR, Estimated: >60 mL/min (ref >60)
Glucose, Bld: 99 mg/dL (ref 70–99)
Potassium: 4.7 mmol/L (ref 3.5–5.1)
Sodium: 133 mmol/L — ABNORMAL LOW (ref 135–145)

## 2022-07-14 LAB — BRAIN NATRIURETIC PEPTIDE: B Natriuretic Peptide: 22.6 pg/mL (ref 0.0–100.0)

## 2022-07-14 LAB — TROPONIN I (HIGH SENSITIVITY): Troponin I (High Sensitivity): 7 ng/L (ref <18)

## 2022-07-14 MED ORDER — AMLODIPINE BESYLATE 10 MG PO TABS: 10.0000 mg | ORAL_TABLET | Freq: Every day | ORAL | 0 refills | Status: AC

## 2022-07-14 NOTE — Discharge Instructions (Signed)
As you had better control while admitted on Norvasc/amlodipine, we will restart this mediatation. Please monitor your blood pressure at home first thing in the morning prior to eating and drinking and journal this for PCP follow-up.  If you remain persistently elevated after your symptoms improve, you may need additional medical management.   Other recommendations for high blood pressure are to decrease the amount of salt in your diet, exercise (150 minutes of moderate intensity exercise weekly), weight loss.  Keep your scheduled primary care appointment.   Go to the emergency department if you develop inability to keep down fluids, severe diarrhea, blood in your vomit or stools, worsening abdominal pain, fever over 100.4 F that is not resolved with Tylenol, severe chest pain, you pass out, or any additional symptoms that concern you.

## 2022-07-14 NOTE — ED Triage Notes (Signed)
Pt presents with hypertension and dizziness x 1 week. He has a BP machine at home and checks it frequently. The last BP reading he got was 180's/85. Pt reports a few cases of dizziness and denies being dizzy at this time.

## 2022-07-14 NOTE — ED Provider Notes (Signed)
[facility]    CSN: [id number] Arrival date & time: 07/14/22  0827      History   Chief Complaint Chief Complaint  Patient presents with   Hypertension   Dizziness    HPI Charles Campos is a 67 y.o. male.   HPI   [name] presents for dizziness and elevated blood pressures. BP 185/83 at home. He does not take anything for his blood pressure.    Set up an appointment with [facility] but does establish care until June 20th. Does not take anything for his blood pressures. Has COPD. Has cough and exertional dyspnea but this is not new.   Denies chest pain, palpitations, lower extremity edema, , headaches.  Has dizziness with standing. Has right eye blurry vision but this is not new and has an upcoming doctors appointment to get new glasses.  Home BP Monitoring: Yes  Exercises:  not active Low salt diet: yes      Past Medical History:  Diagnosis Date   COPD (chronic obstructive pulmonary disease) (HCC)     Patient Active Problem List   Diagnosis Date Noted   Pressure injury of skin 12/09/2021   COPD exacerbation (HCC) 12/07/2021   Tobacco use 12/07/2021   Hyponatremia 12/07/2021   Acute hypoxemic respiratory failure (HCC) 12/07/2021   Alcohol abuse 12/07/2021   Weight loss 12/07/2021   Leukocytosis 12/07/2021    Past Surgical History:  Procedure Laterality Date   APPENDECTOMY     When he was in 8th grade       Home Medications    Prior to Admission medications   Medication Sig Start Date End Date Taking? Authorizing Provider  albuterol (PROVENTIL HFA) 108 (90 Base) MCG/ACT inhaler Inhale 2 puffs into the lungs every 4 (four) hours as needed for wheezing or shortness of breath. 12/14/21  Yes Marrion Coy, MD  amLODipine (NORVASC) 10 MG tablet Take 1 tablet (10 mg total) by mouth daily. 07/14/22  Yes Dinari Stgermaine, DO  fluticasone furoate-vilanterol (BREO ELLIPTA) 200-25 MCG/ACT AEPB Inhale 1 puff into the lungs daily. 12/14/21  Yes Marrion Coy, MD  tiotropium (SPIRIVA HANDIHALER) 18 MCG inhalation capsule Place 1 capsule (18 mcg total) into inhaler and inhale daily. 12/14/21 01/13/22  Marrion Coy, MD    Family History Family History  Problem Relation Age of Onset   Hypertension Mother    Hypertension Brother    Cancer Brother     Social History Social History   Tobacco Use   Smoking status: Former    Packs/day: 1    Types: Cigarettes   Smokeless tobacco: Never  Vaping Use   Vaping Use: Never used  Substance Use Topics   Alcohol use: Yes    Alcohol/week: 42.0 standard drinks of alcohol    Types: 42 Cans of beer per week    Comment: 6 cans of beer per day   Drug use: No     Allergies   Patient has no known allergies.   Review of Systems Review of Systems: negative unless otherwise stated in HPI.      Physical Exam Triage Vital Signs ED Triage Vitals  Enc Vitals Group     BP 07/14/22 0904 (!) 205/93     Pulse Rate 07/14/22 0904 95     Resp 07/14/22 0904 18     Temp 07/14/22 0904 98.3 F (36.8 C)     Temp Source 07/14/22 0904 Oral     SpO2 07/14/22 0904 96 %  Weight --      Height --      Head Circumference --      Peak Flow --      Pain Score 07/14/22 0903 0     Pain Loc --      Pain Edu? --      Excl. in GC? --    No data found.  Updated Vital Signs BP (!) 205/93 (BP Location: Left Arm)   Pulse 95   Temp 98.3 F (36.8 C) (Oral)   Resp 18   SpO2 96%   Visual Acuity Right Eye Distance:   Left Eye Distance:   Bilateral Distance:    Right Eye Near:   Left Eye Near:    Bilateral Near:     Physical Exam GEN:     alert, elderly male and no distress    HENT:  mucus membranes moist,  no nasal discharge  EYES:   pupils equal and reactive, EOM intact NECK:  supple, ROM at baseline RESP:  Diffuse expiatory wheezing, no increased work of breathing  CVS:   regular rate and rhythm, distal pulses intact   ABD:  soft, non-tender; bowel sounds present; no palpable masses EXT:    normal ROM, atraumatic, no edema  NEURO:  normal without focal findings,  speech normal, alert and oriented   Skin:   warm and dry, normal skin turgor Psych: Normal affect, appropriate speech and behavior      UC Treatments / Results  Labs (all labs ordered are listed, but only abnormal results are displayed) Labs Reviewed  BASIC METABOLIC PANEL - Abnormal; Notable for the following components:      Result Value   Sodium 133 (*)    Chloride 97 (*)    Creatinine, Ser 0.58 (*)    All other components within normal limits  CBC WITH DIFFERENTIAL/PLATELET  BRAIN NATRIURETIC PEPTIDE  TROPONIN I (HIGH SENSITIVITY)    EKG  If EKG performed, see my interpretation in the MDM section  Radiology No results found.   Procedures Procedures (including critical care time)  Medications Ordered in UC Medications - No data to display  Initial Impression / Assessment and Plan / UC Course  I have reviewed the triage vital signs and the nursing notes.  Pertinent labs & imaging results that were available during my care of the patient were reviewed by me and considered in my medical decision making (see chart for details).       Pt is a 67 y.o. male with history of COPD, tobacco use, HTN who presents for intermittent dizziness and elevated blood pressures for the past week.  No dizziness today.  EKG showing normal sinus rhythm without acute ST or T wave changes.  High-sensitivity troponin was 7.  BMP significant for mild hyponatremia and hypochloremia, sodium 133 chloride 97.  Serum creatinine stable.  CBC is normal and BNP is not elevated.  Chest imaging is deferred at this time.  Teyon is significantly hypertensive here.  BP 205/93 then after sitting was 174/89.  On reviewing his discharge summary from recent hospitalization, amlodipine after giving good results while he was hospitalized.  States he was told he did not need it anymore. Discussed restarting Norvasc 10 mg as he had normal  blood pressures in the hospital at this and patient is agreeable.    Recommended he check his blood pressure daily and follow up with his primary care provider in the next 2 weeks.Strict ED precautions given and understanding voiced.  Discussed MDM, treatment plan and plan for follow-up with patient who agrees with plan.     Final Clinical Impressions(s) / UC Diagnoses   Final diagnoses:  Hypertensive urgency     Discharge Instructions      As you had better control while admitted on Norvasc/amlodipine, we will restart this mediatation. Please monitor your blood pressure at home first thing in the morning prior to eating and drinking and journal this for PCP follow-up.  If you remain persistently elevated after your symptoms improve, you may need additional medical management.   Other recommendations for high blood pressure are to decrease the amount of salt in your diet, exercise (150 minutes of moderate intensity exercise weekly), weight loss.  Keep your scheduled primary care appointment.   Go to the emergency department if you develop inability to keep down fluids, severe diarrhea, blood in your vomit or stools, worsening abdominal pain, fever over 100.4 F that is not resolved with Tylenol, severe chest pain, you pass out, or any additional symptoms that concern you.       ED Prescriptions     Medication Sig Dispense Auth. Provider   amLODipine (NORVASC) 10 MG tablet Take 1 tablet (10 mg total) by mouth daily. 30 tablet Katha Cabal, DO      PDMP not reviewed this encounter.   Katha Cabal, DO 07/24/22 2346
